# Patient Record
Sex: Male | Born: 1955 | Race: White | Hispanic: No | Marital: Married | Smoking: Never smoker
Health system: Southern US, Community
[De-identification: ages and names within clinical notes are randomized; demographics above are authoritative.]

## PROBLEM LIST (undated history)

## (undated) DIAGNOSIS — E785 Hyperlipidemia, unspecified: Secondary | ICD-10-CM

## (undated) DIAGNOSIS — I440 Atrioventricular block, first degree: Secondary | ICD-10-CM

## (undated) DIAGNOSIS — M419 Scoliosis, unspecified: Secondary | ICD-10-CM

## (undated) DIAGNOSIS — I10 Essential (primary) hypertension: Secondary | ICD-10-CM

## (undated) DIAGNOSIS — M1990 Unspecified osteoarthritis, unspecified site: Secondary | ICD-10-CM

## (undated) DIAGNOSIS — K529 Noninfective gastroenteritis and colitis, unspecified: Secondary | ICD-10-CM

## (undated) DIAGNOSIS — Z87438 Personal history of other diseases of male genital organs: Secondary | ICD-10-CM

## (undated) HISTORY — PX: TONSILLECTOMY: SUR1361

## (undated) HISTORY — DX: Unspecified osteoarthritis, unspecified site: M19.90

## (undated) HISTORY — DX: Scoliosis, unspecified: M41.9

## (undated) HISTORY — DX: Personal history of other diseases of male genital organs: Z87.438

## (undated) HISTORY — DX: Essential (primary) hypertension: I10

## (undated) HISTORY — PX: CATARACT EXTRACTION, BILATERAL: SHX1313

## (undated) HISTORY — DX: Atrioventricular block, first degree: I44.0

## (undated) HISTORY — DX: Hyperlipidemia, unspecified: E78.5

## (undated) HISTORY — DX: Noninfective gastroenteritis and colitis, unspecified: K52.9

---

## 2012-01-22 ENCOUNTER — Ambulatory Visit
Admission: RE | Admit: 2012-01-22 | Discharge: 2012-01-22 | Disposition: A | Discharge status: Home / Self Care | Payer: 59 | Source: Ambulatory Visit | Attending: Family Medicine | Admitting: Family Medicine

## 2012-01-22 ENCOUNTER — Ambulatory Visit (INDEPENDENT_AMBULATORY_CARE_PROVIDER_SITE_OTHER): Payer: 59 | Admitting: Family Medicine

## 2012-01-22 ENCOUNTER — Other Ambulatory Visit: Payer: Self-pay | Admitting: Family Medicine

## 2012-01-22 ENCOUNTER — Encounter: Payer: Self-pay | Admitting: Family Medicine

## 2012-01-22 VITALS — BP 165/98 | HR 66 | Ht 66.75 in | Wt 200.0 lb

## 2012-01-22 DIAGNOSIS — E785 Hyperlipidemia, unspecified: Secondary | ICD-10-CM

## 2012-01-22 DIAGNOSIS — R109 Unspecified abdominal pain: Secondary | ICD-10-CM

## 2012-01-22 DIAGNOSIS — R202 Paresthesia of skin: Secondary | ICD-10-CM

## 2012-01-22 DIAGNOSIS — Z87438 Personal history of other diseases of male genital organs: Secondary | ICD-10-CM

## 2012-01-22 DIAGNOSIS — R209 Unspecified disturbances of skin sensation: Secondary | ICD-10-CM

## 2012-01-22 DIAGNOSIS — M4120 Other idiopathic scoliosis, site unspecified: Secondary | ICD-10-CM

## 2012-01-22 DIAGNOSIS — IMO0001 Reserved for inherently not codable concepts without codable children: Secondary | ICD-10-CM

## 2012-01-22 DIAGNOSIS — R030 Elevated blood-pressure reading, without diagnosis of hypertension: Secondary | ICD-10-CM

## 2012-01-22 DIAGNOSIS — M419 Scoliosis, unspecified: Secondary | ICD-10-CM

## 2012-01-22 LAB — POCT URINALYSIS DIPSTICK
Glucose, UA: NEGATIVE
Ketones, UA: NEGATIVE
Leukocytes, UA: NEGATIVE
Spec Grav, UA: 1.02

## 2012-01-22 NOTE — Progress Notes (Signed)
Subjective:    Patient ID: Lance Hernandez, male    DOB: 10/31/1955, 56 y.o.   MRN: [id number]  HPI He is here today to establish care but he has several concerns. Having numbness in his pinky toes and in the 5th digits of his hands. No known triggers or alleviating factors.   Says is intermitatn. Some heaviness in the back of hte right posterior thigh. Occ feels nauseated. Wonders if has diabetes but is also very anxious.  Passed a kidney stone back in November.  Has some occ discomfort in his left flank that occ radiates into the leg.  No inc thrist or urination. Numbness is worse on the left hand but he is right handed. Occ stiffness in his neck but not very bothersome.    Says his BP normally runs 132/70. His blood pressure is typically elevated when he comes a doctor's office. He has never been on medication for it before.  Hyperlipidemia-he has he has had high cholesterol in the past but when he does well with his diet it is under control. He has been on medication at one point in time it has been years. His last full physical was 8 years ago.  He admits he has a lot of problems with anxiety. He says it tends to run in his family and overall he is just very anxious person.   Review of Systems  Constitutional: Negative for fever, diaphoresis and unexpected weight change.       Weakness.   HENT: Negative for hearing loss, rhinorrhea, sneezing and tinnitus.   Eyes: Negative for visual disturbance.  Respiratory: Negative for cough and wheezing.   Cardiovascular: Negative for chest pain and palpitations.  Gastrointestinal: Negative for nausea, vomiting, diarrhea and blood in stool.  Genitourinary: Negative for dysuria and discharge.  Musculoskeletal: Negative for myalgias and arthralgias.  Skin: Negative for rash.  Neurological: Negative for headaches.  Hematological: Negative for adenopathy.  Psychiatric/Behavioral: Negative for sleep disturbance and dysphoric mood. The patient is  nervous/anxious.        Objective:   Physical Exam  Constitutional: He is oriented to person, place, and time. He appears well-developed and well-nourished.       He is obese.  HENT:  Head: Normocephalic and atraumatic.  Neck: Neck supple. No thyromegaly present.  Cardiovascular: Normal rate, regular rhythm and normal heart sounds.        No carotid bruits  Pulmonary/Chest: Effort normal and breath sounds normal.  Musculoskeletal:       Neck with normal flexion, decreased extension, decreased rotation left and right but symmetric. Normal sidebending bilaterally. Shoulders and hips with normal range of motion. Shoulder, elbow, wrist strength is 5 over 5 bilaterally. He has normal grip with thumb, first finger, fifth finger. He does have sensation in his fifth digits bilaterally but feels decreased compared to his middle fingers. Hip, knee, ankle strength is 5 out of 5 bilaterally. Upper and lower extremity reflexes are 2+ and symmetric. Scoliosis of  thoracic spine.  Lymphadenopathy:    He has no cervical adenopathy.  Neurological: He is alert and oriented to person, place, and time. He has normal reflexes.       Upper and lower extremity reflexes are 2+ and symmetric.  Skin: Skin is warm and dry.  Psychiatric: He has a normal mood and affect. His behavior is normal.          Assessment & Plan:  Paresthesias - this could be coming from spinal stenosis versus herniated disc  issue. Also consider peripheral neuropathy from vitamin B12 deficiency, thyroid problems et Karie Soda. His A1c looks fantastic today. I reassured him he has no sign of diabetes. We will start with a cervical x-ray films as well as some blood work. He'll call for results and see him back in one month.  Elevated BP - Says he is really nervous.  Recheck on his blood pressure was improved but still not at goal. We will recheck his followup in one month. It sounds like his home blood pressures are under good control.  Hx of  high cholesterol. Additionally want to recheck his cholesterol to see where he is at on that. He is definitely overweight and has not been exercising regularly.  Left flank pain-I think this is probably more musculoskeletal related versus a kidney stone. History office was negative for blood today. Thus his pain is worse with activity and movement that I really think this is coming from his back. Upon gentle stretches and can take Tylenol as needed. Consider anti-inflammatory the right now his blood pressures not at goal.

## 2012-01-23 ENCOUNTER — Other Ambulatory Visit: Payer: Self-pay | Admitting: Family Medicine

## 2012-01-23 ENCOUNTER — Encounter: Payer: Self-pay | Admitting: Family Medicine

## 2012-01-23 DIAGNOSIS — E785 Hyperlipidemia, unspecified: Secondary | ICD-10-CM | POA: Insufficient documentation

## 2012-01-23 LAB — COMPLETE METABOLIC PANEL WITH GFR
ALT: 24 U/L (ref 0–53)
AST: 23 U/L (ref 0–37)
CO2: 29 mEq/L (ref 19–32)
Calcium: 10 mg/dL (ref 8.4–10.5)
Chloride: 101 mEq/L (ref 96–112)
GFR, Est African American: >89 mL/min
Sodium: 139 mEq/L (ref 135–145)
Total Bilirubin: 0.9 mg/dL (ref 0.3–1.2)
Total Protein: 8 g/dL (ref 6.0–8.3)

## 2012-01-23 LAB — CBC
MCH: 31.8 pg (ref 26.0–34.0)
MCV: 94 fL (ref 78.0–100.0)
Platelets: 248 10*3/uL (ref 150–400)
RBC: 5.16 MIL/uL (ref 4.22–5.81)
RDW: 12.8 % (ref 11.5–15.5)

## 2012-01-23 LAB — LIPID PANEL
HDL: 64 mg/dL (ref >39)
LDL Cholesterol: 137 mg/dL — ABNORMAL HIGH (ref 0–99)
Triglycerides: 149 mg/dL (ref <150)
VLDL: 30 mg/dL (ref 0–40)

## 2012-01-23 LAB — CK: Total CK: 58 U/L (ref 7–232)

## 2012-01-23 LAB — TSH: TSH: 1.695 u[IU]/mL (ref 0.350–4.500)

## 2012-01-23 MED ORDER — PRAVASTATIN SODIUM 40 MG PO TABS: 40.0000 mg | ORAL_TABLET | Freq: Every day | ORAL | Status: DC

## 2012-01-29 ENCOUNTER — Ambulatory Visit
Admission: RE | Admit: 2012-01-29 | Discharge: 2012-01-29 | Disposition: A | Discharge status: Home / Self Care | Payer: 59 | Source: Ambulatory Visit | Attending: Sports Medicine | Admitting: Sports Medicine

## 2012-01-29 ENCOUNTER — Other Ambulatory Visit: Payer: Self-pay | Admitting: Sports Medicine

## 2012-01-29 DIAGNOSIS — M545 Low back pain, unspecified: Secondary | ICD-10-CM

## 2012-02-21 ENCOUNTER — Ambulatory Visit: Payer: 59 | Admitting: Family Medicine

## 2012-06-11 ENCOUNTER — Other Ambulatory Visit: Payer: Self-pay | Admitting: *Deleted

## 2012-06-11 MED ORDER — PRAVASTATIN SODIUM 40 MG PO TABS: 40.0000 mg | ORAL_TABLET | Freq: Every day | ORAL | Status: DC

## 2012-06-17 ENCOUNTER — Telehealth: Payer: Self-pay | Admitting: Family Medicine

## 2012-06-17 ENCOUNTER — Ambulatory Visit (INDEPENDENT_AMBULATORY_CARE_PROVIDER_SITE_OTHER): Payer: 59 | Admitting: Family Medicine

## 2012-06-17 ENCOUNTER — Encounter: Payer: Self-pay | Admitting: Family Medicine

## 2012-06-17 ENCOUNTER — Ambulatory Visit (INDEPENDENT_AMBULATORY_CARE_PROVIDER_SITE_OTHER): Payer: 59

## 2012-06-17 VITALS — BP 173/98 | HR 77 | Wt 227.0 lb

## 2012-06-17 DIAGNOSIS — R053 Chronic cough: Secondary | ICD-10-CM

## 2012-06-17 DIAGNOSIS — R0602 Shortness of breath: Secondary | ICD-10-CM

## 2012-06-17 DIAGNOSIS — I10 Essential (primary) hypertension: Secondary | ICD-10-CM

## 2012-06-17 DIAGNOSIS — R059 Cough, unspecified: Secondary | ICD-10-CM

## 2012-06-17 DIAGNOSIS — R42 Dizziness and giddiness: Secondary | ICD-10-CM

## 2012-06-17 DIAGNOSIS — R0989 Other specified symptoms and signs involving the circulatory and respiratory systems: Secondary | ICD-10-CM

## 2012-06-17 DIAGNOSIS — R05 Cough: Secondary | ICD-10-CM

## 2012-06-17 DIAGNOSIS — I440 Atrioventricular block, first degree: Secondary | ICD-10-CM

## 2012-06-17 LAB — LIPID PANEL
HDL: 60 mg/dL (ref >39)
LDL Cholesterol: 148 mg/dL — ABNORMAL HIGH (ref 0–99)
Total CHOL/HDL Ratio: 4 Ratio

## 2012-06-17 LAB — CBC WITH DIFFERENTIAL/PLATELET
Basophils Absolute: 0 10*3/uL (ref 0.0–0.1)
Basophils Relative: 1 % (ref 0–1)
Hemoglobin: 15.5 g/dL (ref 13.0–17.0)
MCHC: 34.6 g/dL (ref 30.0–36.0)
Neutro Abs: 2.3 10*3/uL (ref 1.7–7.7)
Neutrophils Relative %: 55 % (ref 43–77)
Platelets: 232 10*3/uL (ref 150–400)
RDW: 13.7 % (ref 11.5–15.5)

## 2012-06-17 LAB — TSH: TSH: 3.373 u[IU]/mL (ref 0.350–4.500)

## 2012-06-17 LAB — COMPLETE METABOLIC PANEL WITH GFR
ALT: 52 U/L (ref 0–53)
CO2: 26 mEq/L (ref 19–32)
Chloride: 104 mEq/L (ref 96–112)
GFR, Est African American: >89 mL/min
Sodium: 141 mEq/L (ref 135–145)
Total Bilirubin: 1 mg/dL (ref 0.3–1.2)
Total Protein: 7.5 g/dL (ref 6.0–8.3)

## 2012-06-17 MED ORDER — LISINOPRIL 20 MG PO TABS: 20.0000 mg | ORAL_TABLET | Freq: Every day | ORAL | Status: DC

## 2012-06-17 NOTE — Telephone Encounter (Signed)
Patient request to know if a generic Lisinopril can be called into [facility] in [place] today just to try out first and then if the prescription works he would like it called into [facility]. Thanks

## 2012-06-17 NOTE — Progress Notes (Signed)
Subjective:    Patient ID: Lance Hernandez, male    DOB: [birth year], 56 y.o.   MRN: [id number]  HPI Has gained 30lbs in the last 6 months bc not working out.  BP is high today.  Last 3 days has felt it is a little hard to breath. Noticed a rusty phlegm taste in his mouth that started a few days before the SOB.  Has had a cough as well.  Says feeling a little lightheaded like he might pass out. No actual syncope. No vertigo. No recent head trauma or injuries..  Traveling next week. No fever.  No HA.  Mild nasal congestion. No facial pain and pressure.  Does have a hx of anxiety.  No CP.  No palpitations.  Not a smoker.  He says his blood pressures always high when he comes to the doctor's office. He also reports a chronic cough that comes and goes.   Review of Systems  BP 173/98  Pulse 77  Wt 227 lb (102.967 kg)    Allergies  Allergen Reactions  . Sulfa Antibiotics Rash    Past Medical History  Diagnosis Date  . Hypertension   . Hyperlipidemia   . Colitis     No past surgical history on file.  History   Social History  . Marital Status: Married    Spouse Name: susan     Number of Children: N/A  . Years of Education: N/A   Occupational History  . Not on file.   Social History Main Topics  . Smoking status: Never Smoker   . Smokeless tobacco: Not on file  . Alcohol Use: No  . Drug Use: No  . Sexually Active: Yes -- Male partner(s)   Other Topics Concern  . Not on file   Social History Narrative   Next caffeine. No regular exercise. He does plan on joining a gym. His wife is diabetic.    Family History  Problem Relation Age of Onset  . COPD Mother     Smoker  . COPD Sister     Smoker    Outpatient Encounter Prescriptions as of 06/17/2012  Medication Sig Dispense Refill  . pravastatin (PRAVACHOL) 40 MG tablet Take 1 tablet (40 mg total) by mouth at bedtime.  90 tablet  0          Objective:   Physical Exam  Constitutional: He is oriented to person, place,  and time. He appears well-developed and well-nourished.  HENT:  Head: Normocephalic and atraumatic.  Right Ear: External ear normal.  Left Ear: External ear normal.  Nose: Nose normal.  Mouth/Throat: Oropharynx is clear and moist.       TMs and canals are clear.   Eyes: Conjunctivae and EOM are normal. Pupils are equal, round, and reactive to light.  Neck: Neck supple. No thyromegaly present.  Cardiovascular: Normal rate and normal heart sounds.   Pulmonary/Chest: Effort normal and breath sounds normal.  Abdominal: Soft. Bowel sounds are normal. He exhibits no distension and no mass. There is no tenderness. There is no rebound and no guarding.  Lymphadenopathy:    He has no cervical adenopathy.  Neurological: He is alert and oriented to person, place, and time.  Skin: Skin is warm and dry.  Psychiatric: He has a normal mood and affect.    Mouth appears dry today      Assessment & Plan:  Dizziness - this may be coming from his high blood pressure. Also consider the possibility of sinusitis even  though he is not having fevers or facial pain or pressure but he is having some chronic drainage that he says tastes rusty. Check EKG today.   Short of breath-will get an EKG today he's not having actual chest pain.  I would also like to get a chest x-ray today. We will also check a CBC with differential to rule out infectious cause. His lung exam is normal which makes bronchitis less likely but he does have a cough that is productive as well as some increased shortness of breath at the last few days. He has been afebrile. EKG shows rate of 69 beats per minute, normal sinus rhythm, normal axis. Increased PR interval, first-degree AV block. T wave is inverted in lead 3. No other acute ST-T wave changes. I will check to see if having old EKGs for comparison.  Hypertension-uncontrolled. Most likely increase from his recent weight gain. We will start an ACE inhibitor. Would like to check his creatinine  and kidney function today. Followup in 3 weeks. Continue work on low-fat low-salt diet and regular exercise as tolerated.  First degree AV block-as far as I can tell this is new. Unfortunately do not have any old EKGs for comparison. This could be from cardiomyopathy versus ischemic disease. He is on the medications that should cause this. I would like to refer him to cardiology to see if they recommend further workup. He is asymptomatic as far as chest pain is concerned.

## 2012-06-17 NOTE — Telephone Encounter (Signed)
Med sent to [facility] per pt's request.

## 2012-06-18 NOTE — Telephone Encounter (Signed)
Labs ordered.

## 2012-06-19 ENCOUNTER — Ambulatory Visit (INDEPENDENT_AMBULATORY_CARE_PROVIDER_SITE_OTHER): Payer: 59 | Admitting: Family Medicine

## 2012-06-19 ENCOUNTER — Encounter: Payer: Self-pay | Admitting: Family Medicine

## 2012-06-19 VITALS — BP 159/93 | HR 78 | Temp 97.8°F | Ht 66.75 in | Wt 228.0 lb

## 2012-06-19 DIAGNOSIS — R0602 Shortness of breath: Secondary | ICD-10-CM

## 2012-06-19 MED ORDER — ALBUTEROL SULFATE HFA 108 (90 BASE) MCG/ACT IN AERS: 2.0000 | INHALATION_SPRAY | Freq: Four times a day (QID) | RESPIRATORY_TRACT | Status: DC | PRN

## 2012-06-19 NOTE — Progress Notes (Signed)
CC: Lance Hernandez is a 56 y.o. male is here for Shortness of Breath   Subjective: HPI:  Patient presents due to continued shortness of breath, he has not developed any worse but doesn't feel that much better even since being seen in our office 2 days earlier. He describes his discomfort as a "I need to gasp for breath at time. "Interestingly he feels that it actually improves sometimes when he is up moving around. He denies any orthopnea, exertional chest pain, peripheral edema. This discomfort is been present for a little over a week now and has been associated with a "phlegmy cough". He denies any sensation of chest congestion or wheezing, he denies any color to his productive cough. He denies any fevers, chills, abdominal pain. He states that he is suspicious that anxiety may be playing into his shortness of breath but is not sure what he is anxious about.  Interventions have included a chest x-ray which was unremarkable, anemia has been ruled out, a d-dimer was negative, and a metabolic panel was essentially normal as well from his last visit. An EKG showed first-degree AV block but no other abnormalities.  He tells me that his oldest of 2 weeks worth of cigarettes and that was decades ago.    Review Of Systems Outlined In HPI  Past Medical History  Diagnosis Date  . Hypertension   . Hyperlipidemia   . Colitis      Family History  Problem Relation Age of Onset  . COPD Mother     Smoker  . COPD Sister     Smoker     History  Substance Use Topics  . Smoking status: Never Smoker   . Smokeless tobacco: Not on file  . Alcohol Use: No     Objective: Filed Vitals:   06/19/12 1123  BP: 159/93  Pulse: 78  Temp: 97.8 F (36.6 C)    General: Alert and Oriented, No Acute Distress HEENT: Pupils equal, round, reactive to light. Conjunctivae clear.  External ears unremarkable, canals clear with intact TMs with appropriate landmarks.  Middle ear appears open without effusion. Pink  inferior turbinates.  Moist mucous membranes, pharynx without inflammation nor lesions.  Neck supple without palpable lymphadenopathy nor abnormal masses. Lungs: Clear to auscultation bilaterally, no wheezing/ronchi/rales.  Comfortable work of breathing. Good air movement. Cardiac: Regular rate and rhythm. Normal S1/S2.  No murmurs, rubs, nor gallops.   Abdomen: Obese Normal bowel sounds, soft and non tender without palpable masses. Extremities: No peripheral edema.  Strong peripheral pulses.  Mental Status: No depression, anxiety, nor agitation. Skin: Warm and dry.  Assessment & Plan: Lance Hernandez was seen today for shortness of breath.  Diagnoses and associated orders for this visit:  Shortness of breath  Other Orders - albuterol (PROVENTIL HFA;VENTOLIN HFA) 108 (90 BASE) MCG/ACT inhaler; Inhale 2 puffs into the lungs every 6 (six) hours as needed for wheezing.    in office spirometry was performed with an FVC of 101%, FEV1 of 83%, FEV1 1 over FVC of 84%. He doesn't appear that he has a restrictive defect nor an considerable obstructive defect. Reassurance was given to the patient today after discussion of blood work, EKG and screening lung function tests that showed no considerable abnormalities to account for her shortness of breath. We discussed a trial of albuterol to see if that would help alleviate his symptoms which he was quite interested in. He has an appointment with a cardiologist later this month have asked him to return for  shortness of breath has not improved or deteriorated after his week of vacation coming upSigns and symptoms requring emergent/urgent reevaluation were discussed with the patient.  Return if symptoms worsen or fail to improve.  Requested Prescriptions   Signed Prescriptions Disp Refills  . albuterol (PROVENTIL HFA;VENTOLIN HFA) 108 (90 BASE) MCG/ACT inhaler 1 Inhaler 2    Sig: Inhale 2 puffs into the lungs every 6 (six) hours as needed for wheezing.

## 2012-06-24 ENCOUNTER — Telehealth: Payer: Self-pay | Admitting: *Deleted

## 2012-06-24 DIAGNOSIS — R0602 Shortness of breath: Secondary | ICD-10-CM

## 2012-07-07 ENCOUNTER — Encounter: Payer: Self-pay | Admitting: Cardiology

## 2012-07-07 ENCOUNTER — Encounter: Payer: Self-pay | Admitting: *Deleted

## 2012-07-08 ENCOUNTER — Ambulatory Visit (INDEPENDENT_AMBULATORY_CARE_PROVIDER_SITE_OTHER): Payer: 59 | Admitting: Cardiology

## 2012-07-08 ENCOUNTER — Encounter: Payer: Self-pay | Admitting: Cardiology

## 2012-07-08 VITALS — BP 169/102 | HR 95 | Wt 226.0 lb

## 2012-07-08 DIAGNOSIS — E785 Hyperlipidemia, unspecified: Secondary | ICD-10-CM

## 2012-07-08 DIAGNOSIS — I10 Essential (primary) hypertension: Secondary | ICD-10-CM

## 2012-07-08 DIAGNOSIS — I440 Atrioventricular block, first degree: Secondary | ICD-10-CM

## 2012-07-08 DIAGNOSIS — R0600 Dyspnea, unspecified: Secondary | ICD-10-CM | POA: Insufficient documentation

## 2012-07-08 NOTE — Assessment & Plan Note (Signed)
Patient's blood pressure is elevated today. However he is anxious in the office. I have asked him to continue his present medications and follow blood pressure. Increase medications as needed.

## 2012-07-08 NOTE — Assessment & Plan Note (Signed)
No significant symptoms. No further evaluation.

## 2012-07-08 NOTE — Assessment & Plan Note (Signed)
Etiology unclear. Recent d-dimer negative. Chest x-ray unremarkable. Spirometry appears reasonable. Question component of deconditioning. Patient has risk factors and is planning to initiate an exercise program. Will arrange exercise treadmill for risk stratification. Will arrange echocardiogram to quantify LV function. Discussed importance of weight loss and exercise.

## 2012-07-08 NOTE — Assessment & Plan Note (Signed)
Continue statin. 

## 2012-07-08 NOTE — Patient Instructions (Addendum)
Your physician recommends that you schedule a follow-up appointment in: AS NEEDED PENDING TEST RESULTS  Your physician has requested that you have an exercise tolerance test. For further information please visit [email or web address] Please also follow instruction sheet, as given.AT [facility] OFFICE   Your physician has requested that you have an echocardiogram. Echocardiography is a painless test that uses sound waves to create images of your heart. It provides your doctor with information about the size and shape of your heart and how well your heart's chambers and valves are working. This procedure takes approximately one hour. There are no restrictions for this procedure.AT [facility] OFFICE    Exercise Stress Electrocardiography An exercise stress test is a heart test (EKG) which is done while you are moving. You will walk on a treadmill. This test will tell your doctor how your heart does when it is forced to work harder and how much activity you can safely handle. BEFORE THE TEST  Wear shorts or athletic pants.   Wear comfortable tennis shoes.   Women need to wear a bra that allows patches to be put on under it.  TEST  An EKG cable will be attached to your waist. This cable is hooked up to patches, which look like round stickers stuck to your chest.   You will be asked to walk on the treadmill.   You will walk until you are too tired or until you are told to stop.   Tell the doctor right away if you have:   Chest pain.   Leg cramps.   Shortness of breath.   Dizziness.   The test may last 30 minutes to 1 hour. The timing depends on your physical condition and the condition of your heart.  AFTER THE TEST  You will rest for about 6 minutes. During this time, your heart rhythm and blood pressure will be checked.   The testing equipment will be removed from your body and you can get dressed.   You may go home or back to your hospital room. You may keep doing all your usual  activities as told by your doctor.  Finding out the results of your test Ask when your test results will be ready. Make sure you get your test results. Document Released: 03/18/2008 Document Revised: 09/19/2011 Document Reviewed: 03/18/2008 [facility] Patient Information 2012 [place], [place].

## 2012-07-08 NOTE — Progress Notes (Signed)
  HPI: 56 year old male for evaluation of dyspnea. Chest x-ray on 06/17/2012 showed probable bibasilar scarring but otherwise clear. D-dimer 0.27. Renal function normal. TSH 3.373. Hemoglobin 15.5. In office spirometry on September 6 showed an FVC of 101%, FEV1 of 83%, and FEV1/FVC of 84%. Patient describes dyspnea with more moderate activities but not routine activities. There is no orthopnea, PND or pedal edema. He occasionally feels a sensation like "I need to take a deep breath". He denies chest pain, palpitations or syncope. Because of the above we were asked to evaluate.  Current Outpatient Prescriptions  Medication Sig Dispense Refill  . albuterol (PROVENTIL HFA;VENTOLIN HFA) 108 (90 BASE) MCG/ACT inhaler Inhale 2 puffs into the lungs every 6 (six) hours as needed for wheezing.  1 Inhaler  2  . lisinopril (PRINIVIL,ZESTRIL) 20 MG tablet Take 1 tablet (20 mg total) by mouth daily.  30 tablet  0  . pravastatin (PRAVACHOL) 40 MG tablet Take 1 tablet (40 mg total) by mouth at bedtime.  90 tablet  0    Allergies  Allergen Reactions  . Sulfa Antibiotics Rash    Past Medical History  Diagnosis Date  . Hypertension   . Hyperlipidemia   . Colitis   . Scoliosis   . History of prostatitis   . First degree AV block   . DJD (degenerative joint disease)     Past Surgical History  Procedure Date  . Tonsillectomy     History   Social History  . Marital Status: Married    Spouse Name: susan     Number of Children: 1  . Years of Education: N/A   Occupational History  .      Computer work   Social History Main Topics  . Smoking status: Never Smoker   . Smokeless tobacco: Not on file  . Alcohol Use: Yes     Rarely  . Drug Use: No  . Sexually Active: Yes -- Male partner(s)   Other Topics Concern  . Not on file   Social History Narrative   Next caffeine. No regular exercise. He does plan on joining a gym. His wife is diabetic.    Family History  Problem Relation Age of  Onset  . COPD Mother     Smoker  . COPD Sister     Smoker    ROS: some low back pain but no fevers or chills, productive cough, hemoptysis, dysphasia, odynophagia, melena, hematochezia, dysuria, hematuria, rash, seizure activity, orthopnea, PND, pedal edema, claudication. Remaining systems are negative.  Physical Exam:  Blood pressure 169/102, pulse 95, weight 226 lb (102.513 kg).  General:  Well developed/obese in NAD Skin warm/dry Patient not depressed No peripheral clubbing Back-normal HEENT-normal/normal eyelids Neck supple/normal carotid upstroke bilaterally; no bruits; no JVD; no thyromegaly chest - CTA/ normal expansion CV - RRR/normal S1 and S2; no murmurs, rubs or gallops;  PMI nondisplaced Abdomen -NT/ND, no HSM, no mass, + bowel sounds, no bruit 2+ femoral pulses, no bruits Ext-no edema, chords, 2+ DP Neuro-grossly nonfocal  ECG 06/17/2012-sinus rhythm with first degree AV block. No ST changes.

## 2012-07-13 ENCOUNTER — Other Ambulatory Visit: Payer: Self-pay | Admitting: *Deleted

## 2012-07-13 MED ORDER — LISINOPRIL 20 MG PO TABS: 20.0000 mg | ORAL_TABLET | Freq: Every day | ORAL | Status: DC

## 2012-07-17 ENCOUNTER — Ambulatory Visit (HOSPITAL_COMMUNITY): Payer: 59 | Attending: Internal Medicine

## 2012-07-17 DIAGNOSIS — R0609 Other forms of dyspnea: Secondary | ICD-10-CM | POA: Insufficient documentation

## 2012-07-17 DIAGNOSIS — I10 Essential (primary) hypertension: Secondary | ICD-10-CM | POA: Insufficient documentation

## 2012-07-17 DIAGNOSIS — R0989 Other specified symptoms and signs involving the circulatory and respiratory systems: Secondary | ICD-10-CM | POA: Insufficient documentation

## 2012-07-17 DIAGNOSIS — I379 Nonrheumatic pulmonary valve disorder, unspecified: Secondary | ICD-10-CM | POA: Insufficient documentation

## 2012-07-17 DIAGNOSIS — R0600 Dyspnea, unspecified: Secondary | ICD-10-CM

## 2012-07-17 DIAGNOSIS — I369 Nonrheumatic tricuspid valve disorder, unspecified: Secondary | ICD-10-CM | POA: Insufficient documentation

## 2012-07-17 DIAGNOSIS — R0602 Shortness of breath: Secondary | ICD-10-CM

## 2012-07-17 NOTE — Progress Notes (Signed)
Echocardiogram performed.  

## 2012-07-20 ENCOUNTER — Telehealth: Payer: Self-pay | Admitting: Cardiology

## 2012-07-20 NOTE — Telephone Encounter (Signed)
New mess:  Pt had EKG on Friday and would like to get the results.  Please call

## 2012-07-20 NOTE — Telephone Encounter (Signed)
Spoke with pt, aware of echo results. 

## 2012-07-22 ENCOUNTER — Ambulatory Visit (INDEPENDENT_AMBULATORY_CARE_PROVIDER_SITE_OTHER): Payer: 59 | Admitting: Physician Assistant

## 2012-07-22 DIAGNOSIS — R0602 Shortness of breath: Secondary | ICD-10-CM

## 2012-07-22 DIAGNOSIS — R0600 Dyspnea, unspecified: Secondary | ICD-10-CM

## 2012-07-22 NOTE — Progress Notes (Addendum)
Exercise Treadmill Test  Pre-Exercise Testing Evaluation Rhythm: normal sinus  Rate: 65   PR:  .20 QRS:  .09  QT:  .39 QTc: .41           Test  Exercise Tolerance Test Ordering MD: Olga Millers, MD  Interpreting MD:  Tereso Newcomer, PA-C  Unique Test No: 1  Treadmill:  1  Indication for ETT: exertional dyspnea  Contraindication to ETT: No   Stress Modality: exercise - treadmill  Cardiac Imaging Performed: non   Protocol: standard Bruce - maximal  Max BP:  192/80  Max MPHR (bpm):  165 85% MPR (bpm):  140  MPHR obtained (bpm):  166 % MPHR obtained:  100%  Reached 85% MPHR (min:sec):  5:20 Total Exercise Time (min-sec):  8:00  Workload in METS:  10.1 Borg Scale: 16  Reason ETT Terminated:  desired heart rate attained    ST Segment Analysis At Rest: normal ST segments - no evidence of significant ST depression With Exercise: no evidence of significant ST depression  Other Information Arrhythmia:  No Angina during ETT:  absent (0) Quality of ETT:  diagnostic  ETT Interpretation:  normal - no evidence of ischemia by ST analysis  Comments: Good exercise tolerance. No chest pain. Normal BP response to exercise. No ST-T changes to suggest ischemia.   Recommendations: Follow up with Dr. Olga Millers as recommended. Tereso Newcomer, PA-C  1:32 PM 07/22/2012

## 2012-07-29 ENCOUNTER — Telehealth: Payer: Self-pay | Admitting: Cardiology

## 2012-07-29 NOTE — Telephone Encounter (Signed)
Spoke with pt, aware of gxt results. He will try to get a bp cuff and check his bp when he is feeling lightheaded. He will start an exercise program and let us know of any problems

## 2012-07-29 NOTE — Telephone Encounter (Signed)
Pt would like results of stress test 

## 2012-08-18 ENCOUNTER — Other Ambulatory Visit: Payer: Self-pay | Admitting: Family Medicine

## 2012-09-23 ENCOUNTER — Other Ambulatory Visit: Payer: Self-pay | Admitting: Family Medicine

## 2013-01-22 IMAGING — CR DG LUMBAR SPINE COMPLETE 4+V
5 series · 5 of 5 positions shown · non-contrast
Comparison: None

CLINICAL DATA: Low back pain worse in the last 2 months

LUMBAR SPINE - COMPLETE 4+ VIEW

[view not recorded (1 of 5)]
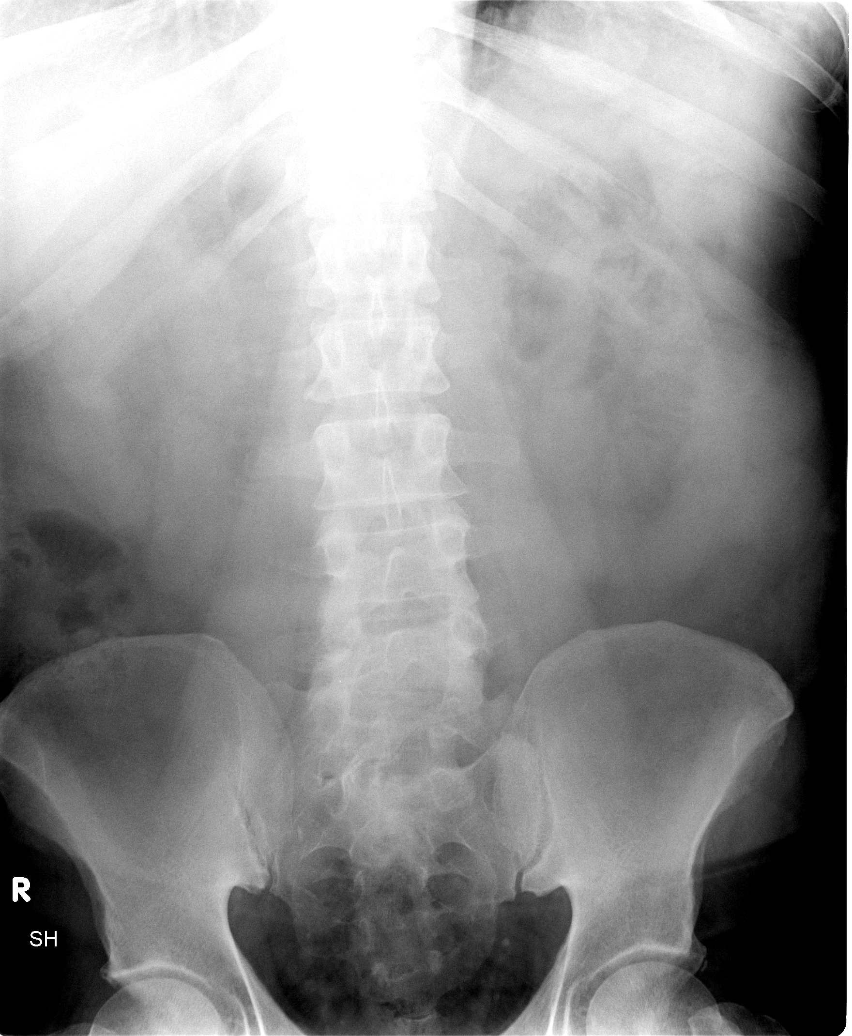

[view not recorded (2 of 5)]
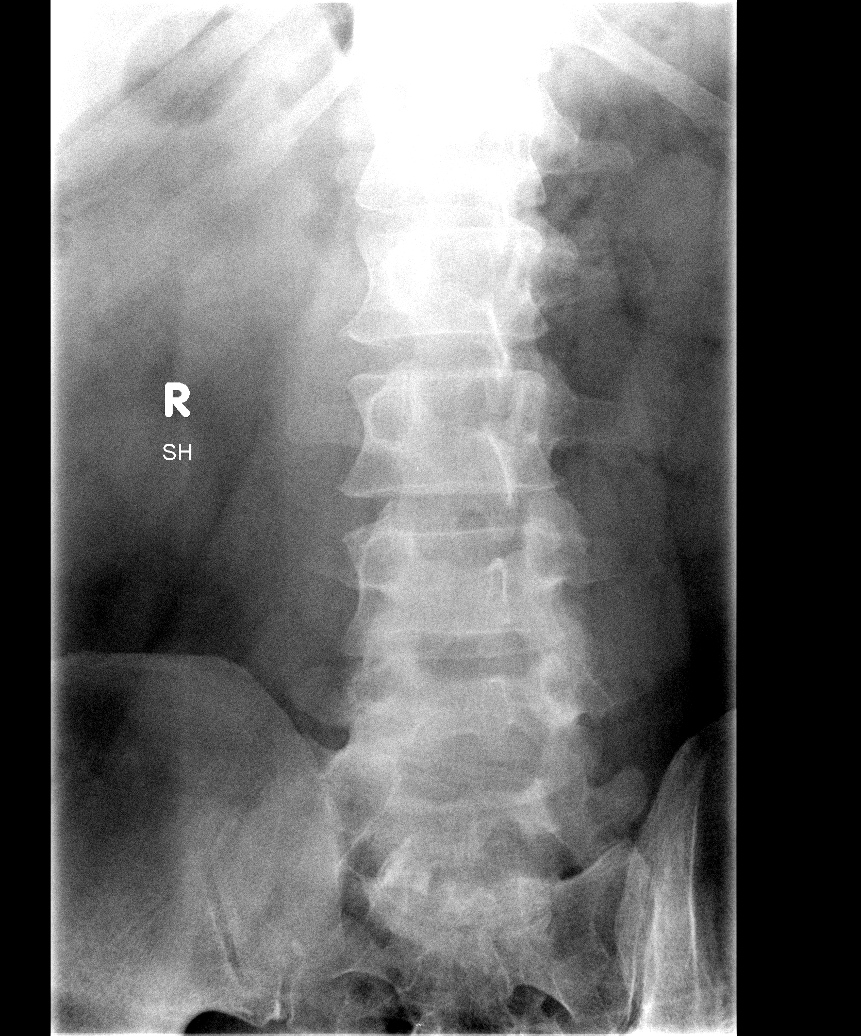

[view not recorded (3 of 5)]
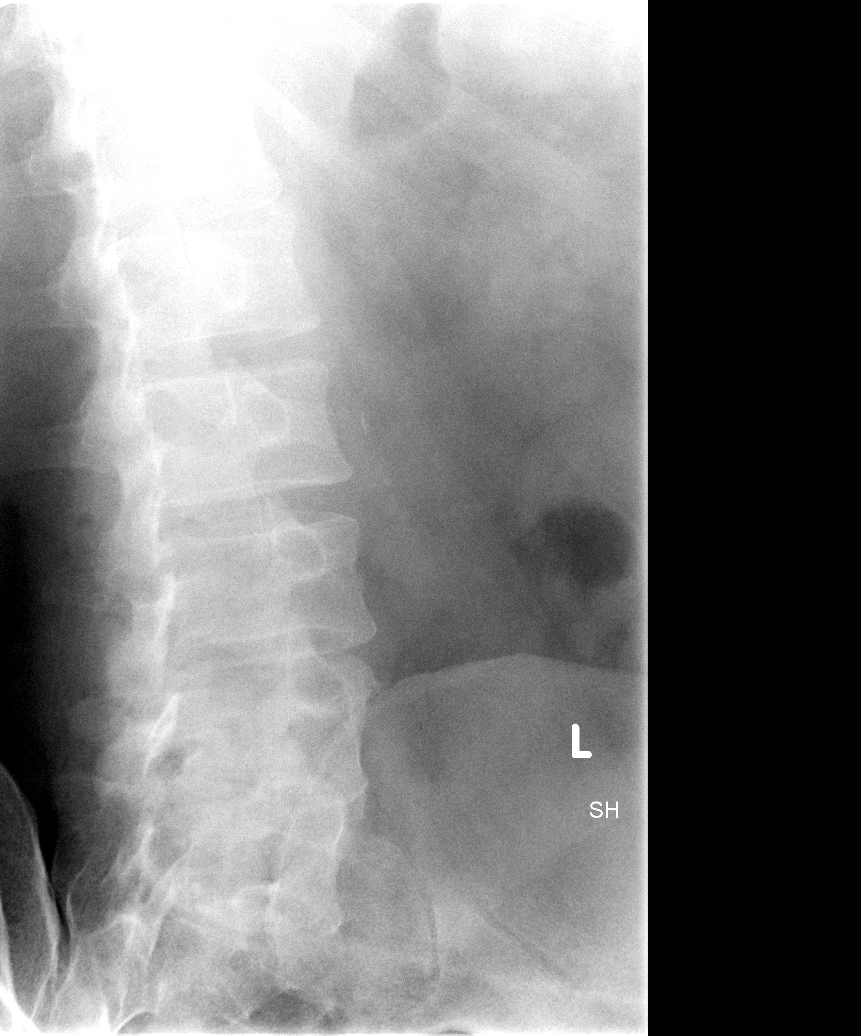

[view not recorded (4 of 5)]
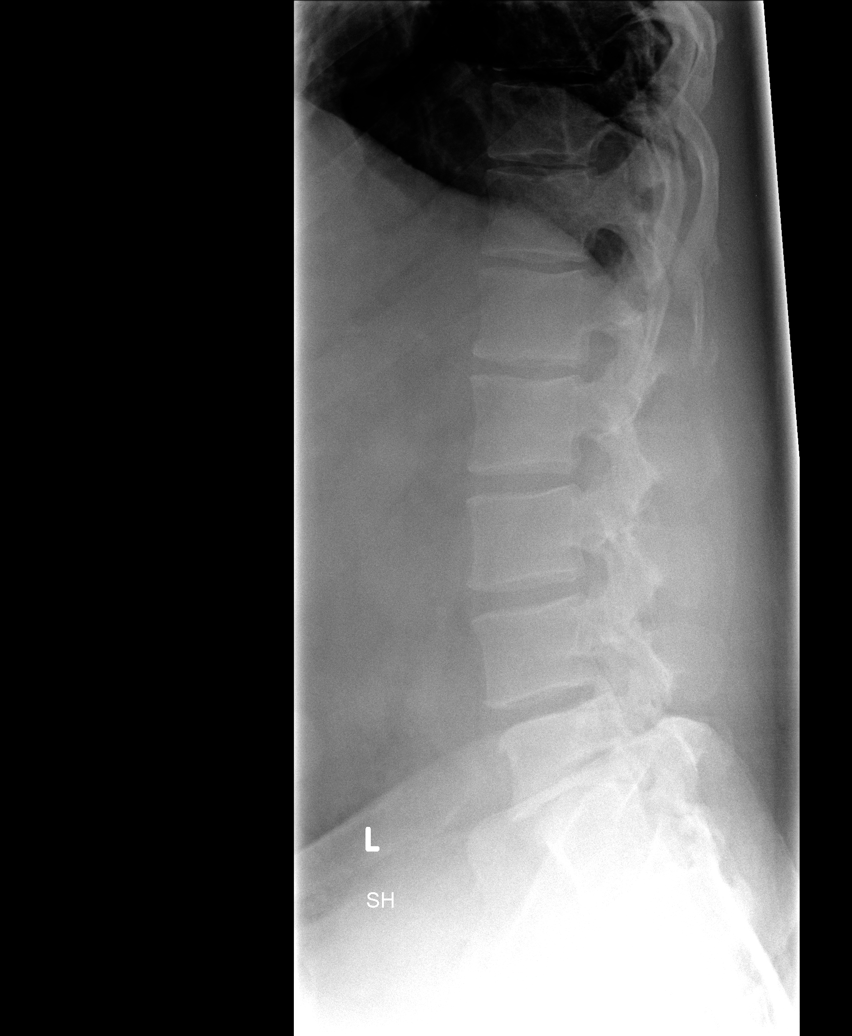

[view not recorded (5 of 5)]
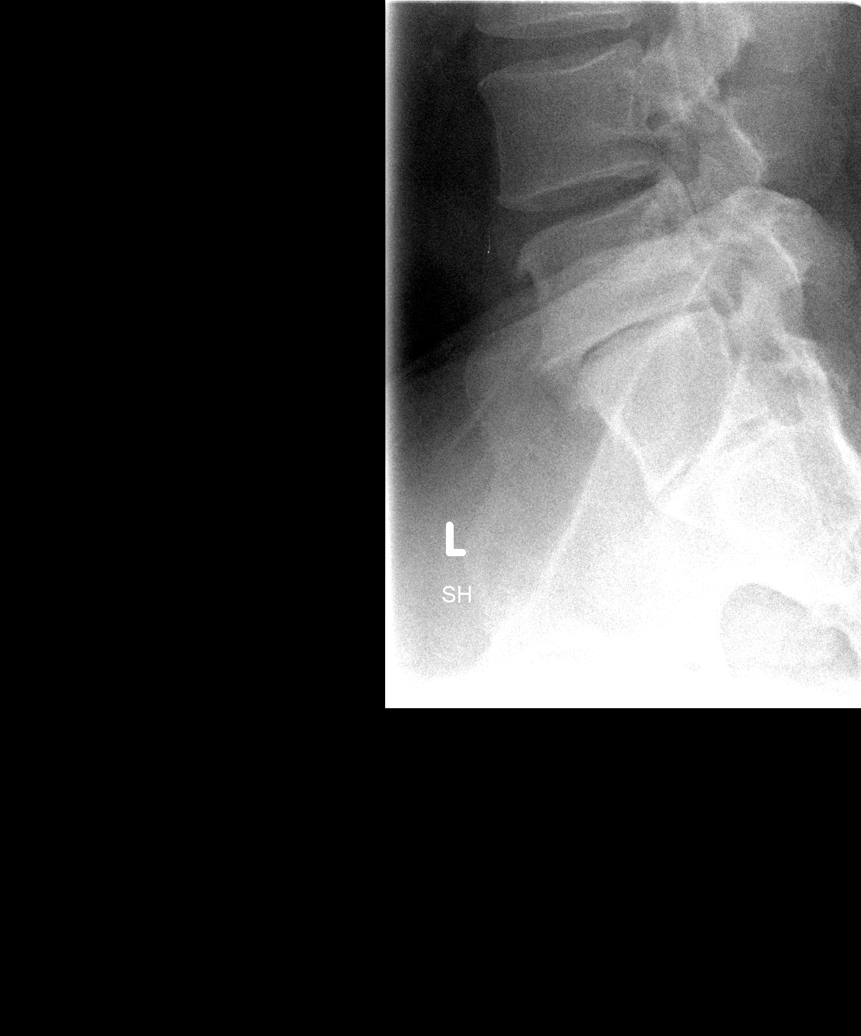

[5 of 5 positions shown; findings below may reference images not displayed]

FINDINGS: Five non-rib bearing lumbar vertebrae with an additional
incompletely sacralized segment at the lumbosacral junction.
Osseous mineralization normal.
Significant disc space narrowing with endplate spur formation at
the last open disc space.
Vertebral body heights maintained without fracture or subluxation.
No spondylolysis.
Facet joints symmetric.
IMPRESSION: Transitional anatomy with an incompletely sacralized segment at the
upper sacrum.
Degenerative disc disease changes lower lumbar spine.

## 2013-02-13 ENCOUNTER — Other Ambulatory Visit: Payer: Self-pay | Admitting: Family Medicine

## 2013-03-17 ENCOUNTER — Other Ambulatory Visit: Payer: Self-pay | Admitting: Family Medicine

## 2013-04-29 ENCOUNTER — Ambulatory Visit (INDEPENDENT_AMBULATORY_CARE_PROVIDER_SITE_OTHER): Payer: 59 | Admitting: Family Medicine

## 2013-04-29 ENCOUNTER — Encounter: Payer: Self-pay | Admitting: Family Medicine

## 2013-04-29 VITALS — BP 134/84 | HR 84 | Ht 67.0 in | Wt 230.0 lb

## 2013-04-29 DIAGNOSIS — I10 Essential (primary) hypertension: Secondary | ICD-10-CM

## 2013-04-29 DIAGNOSIS — N400 Enlarged prostate without lower urinary tract symptoms: Secondary | ICD-10-CM

## 2013-04-29 DIAGNOSIS — Z6836 Body mass index (BMI) 36.0-36.9, adult: Secondary | ICD-10-CM

## 2013-04-29 DIAGNOSIS — Z23 Encounter for immunization: Secondary | ICD-10-CM

## 2013-04-29 DIAGNOSIS — E785 Hyperlipidemia, unspecified: Secondary | ICD-10-CM

## 2013-04-29 DIAGNOSIS — Z0000 Encounter for general adult medical examination without abnormal findings: Secondary | ICD-10-CM

## 2013-04-29 LAB — COMPLETE METABOLIC PANEL WITH GFR
AST: 27 U/L (ref 0–37)
Albumin: 4.9 g/dL (ref 3.5–5.2)
Alkaline Phosphatase: 51 U/L (ref 39–117)
Potassium: 4.7 mEq/L (ref 3.5–5.3)
Sodium: 134 mEq/L — ABNORMAL LOW (ref 135–145)
Total Protein: 7.6 g/dL (ref 6.0–8.3)

## 2013-04-29 LAB — LIPID PANEL
Total CHOL/HDL Ratio: 3.8 Ratio
VLDL: 28 mg/dL (ref 0–40)

## 2013-04-29 LAB — HEMOGLOBIN A1C
Hgb A1c MFr Bld: 5.4 % (ref <5.7)
Mean Plasma Glucose: 108 mg/dL (ref <117)

## 2013-04-29 MED ORDER — LISINOPRIL 20 MG PO TABS: ORAL_TABLET | ORAL | Status: DC

## 2013-04-29 MED ORDER — PRAVASTATIN SODIUM 40 MG PO TABS: ORAL_TABLET | ORAL | Status: DC

## 2013-04-29 NOTE — Progress Notes (Signed)
Subjective:    Patient ID: Lance Hernandez, male    DOB: [birth year], 56 y.o.   MRN: [id number]  HPI Here for CPE today.   pt is fasting, he would like to discuss weight loss he has been exercising however because of some recent foot pain he has had to cut back. He is having a flare of plantar fascitis.  Started wearing his heel cushions and have helped. He has heard about Belviq and would like to try. He says he really struggles with his diet. He says he is addicted to french fries. He did start exercising recently but started having foot pain. So he has backed off of this.  HTN -  Pt denies chest pain, SOB, dizziness, or heart palpitations.  Taking meds as directed w/o problems.  Denies medication side effects.    He occasionally gets up once at night to urinate but not often. He has noticed some occasional decrease in urinary stream but says that if he drinks more the stream seems to be better. No pelvic pain or discomfort. No prior history of prostate problems or cancer.  Review of Systems   comprehensive ROS.   There were no vitals taken for this visit.    Allergies  Allergen Reactions  . Sulfa Antibiotics Rash    Past Medical History  Diagnosis Date  . Hypertension   . Hyperlipidemia   . Colitis   . Scoliosis   . History of prostatitis   . First degree AV block   . DJD (degenerative joint disease)     Past Surgical History  Procedure Laterality Date  . Tonsillectomy      History   Social History  . Marital Status: Married    Spouse Name: susan     Number of Children: 1  . Years of Education: N/A   Occupational History  .      Computer work   Social History Main Topics  . Smoking status: Never Smoker   . Smokeless tobacco: Not on file  . Alcohol Use: Yes     Comment: Rarely  . Drug Use: No  . Sexually Active: Yes -- Male partner(s)   Other Topics Concern  . Not on file   Social History Narrative   Next caffeine. No regular exercise. He does plan on  joining a gym. His wife is diabetic.    Family History  Problem Relation Age of Onset  . COPD Mother     Smoker  . COPD Sister     Smoker    Outpatient Encounter Prescriptions as of 04/29/2013  Medication Sig Dispense Refill  . albuterol (PROVENTIL HFA;VENTOLIN HFA) 108 (90 BASE) MCG/ACT inhaler Inhale 2 puffs into the lungs every 6 (six) hours as needed for wheezing.  1 Inhaler  2  . lisinopril (PRINIVIL,ZESTRIL) 20 MG tablet TAKE 1 TABLET (20 MG TOTAL) DAILY  90 tablet  1  . pravastatin (PRAVACHOL) 40 MG tablet TAKE 1 TABLET AT BEDTIME  90 tablet  1  . [DISCONTINUED] lisinopril (PRINIVIL,ZESTRIL) 20 MG tablet TAKE 1 TABLET (20 MG TOTAL) DAILY  90 tablet  1  . [DISCONTINUED] pravastatin (PRAVACHOL) 40 MG tablet TAKE 1 TABLET AT BEDTIME  90 tablet  0   No facility-administered encounter medications on file as of 04/29/2013.         Objective:   Physical Exam  Constitutional: He is oriented to person, place, and time. He appears well-developed and well-nourished.  HENT:  Head: Normocephalic and atraumatic.  Right Ear:  External ear normal.  Left Ear: External ear normal.  Nose: Nose normal.  Mouth/Throat: Oropharynx is clear and moist.  Eyes: Conjunctivae and EOM are normal. Pupils are equal, round, and reactive to light.  Neck: Normal range of motion. Neck supple. No thyromegaly present.  Cardiovascular: Normal rate, regular rhythm, normal heart sounds and intact distal pulses.   Pulmonary/Chest: Effort normal and breath sounds normal.  Abdominal: Soft. Bowel sounds are normal. He exhibits no distension and no mass. There is no tenderness. There is no rebound and no guarding.  Musculoskeletal: Normal range of motion.  Lymphadenopathy:    He has no cervical adenopathy.  Neurological: He is alert and oriented to person, place, and time. He has normal reflexes.  Skin: Skin is warm and dry.  Psychiatric: He has a normal mood and affect. His behavior is normal. Judgment and  thought content normal.   + dentures + mildly enlarged prostate, symmetric with no nodules.       Assessment & Plan:  CPE Keep up a regular exercise program and make sure you are eating a healthy diet Try to eat 4 servings of dairy a day, or if you are lactose intolerant take a calcium with vitamin D daily.  Your vaccines are up to date.  Tdap given.  Need for colonoscopy. He is extremely fearful. So we discussed the option of doing stool cards in the interim.  Obesity - discussed the importance of dietary changes and exercise regime. We did discuss using a weight loss medication. We discussed the risks and benefits and potential cost of the medications. For now he says he really wants to try to focus on diet and exercise. Encouraged him to come back in and discussed treatment options if he is getting frustrated and not making progress.  HTN- well controlled. F/U in 6 months.   Hyperlipidemia-- Tolerating statin well.   Plantar fasciitis-he says the heel pad has been helpful. I will give him a handout with stretches to start doing home on his own. If she's not improving and he could consider following up with my partner Dr. Rodney Langton for injections. Hopefully it will improve and he can get back into his exercise routine.  BPH- mild.  Will check psa. Given AUA qeustionnari to complete and drop off when drops off stool cards.

## 2013-05-20 ENCOUNTER — Telehealth (INDEPENDENT_AMBULATORY_CARE_PROVIDER_SITE_OTHER): Payer: 59 | Admitting: Family Medicine

## 2013-05-20 DIAGNOSIS — Z1211 Encounter for screening for malignant neoplasm of colon: Secondary | ICD-10-CM

## 2013-05-20 LAB — POC HEMOCCULT BLD/STL (HOME/3-CARD/SCREEN)
Card #3 Date: 8062014
Card #3 Fecal Occult Blood, POC: NEGATIVE

## 2013-05-20 NOTE — Telephone Encounter (Signed)
Called and lvm informing pt that Dr.Metheney can call in an inhaler for him. We do not have any samples to give to him.[name] [place]

## 2013-05-20 NOTE — Telephone Encounter (Signed)
Patient said he was just seen last month and advised that he has been coughing up white flem and has dizzyness. Pt request to know if he can have something called in to break up flem/ Pt states he is using Mucinex but has been really working. I did advise pt that if he was not seen for acute issue may need to get an appointment and he just stated that he was jus here last month and was wondering if he could possibly have something called in or maybe get a inhaler sample. Thanks

## 2013-05-21 MED ORDER — ALBUTEROL SULFATE HFA 108 (90 BASE) MCG/ACT IN AERS: 2.0000 | INHALATION_SPRAY | Freq: Four times a day (QID) | RESPIRATORY_TRACT | Status: DC | PRN

## 2013-05-21 NOTE — Addendum Note (Signed)
Addended by: Nani Gasser D on: 05/21/2013 08:21 AM   Modules accepted: Orders

## 2013-05-21 NOTE — Telephone Encounter (Signed)
Prescription presented. I was unable to find that particular [facility].

## 2013-05-21 NOTE — Telephone Encounter (Signed)
Pt informed about the rx being sent, also informed him that his stool cards were neg.[name] [place]

## 2013-05-21 NOTE — Telephone Encounter (Signed)
Pt would like the inhaler to be sent to the new [facility] on [address]. He was asking about the cost of this  And I told him it would be about $30-45? Will forward to Dr.Metheney.[name] [place]

## 2013-07-08 ENCOUNTER — Encounter: Payer: Self-pay | Admitting: Family Medicine

## 2013-07-08 ENCOUNTER — Ambulatory Visit (INDEPENDENT_AMBULATORY_CARE_PROVIDER_SITE_OTHER): Payer: 59 | Admitting: Family Medicine

## 2013-07-08 VITALS — BP 149/91 | HR 70 | Temp 97.7°F | Wt 233.0 lb

## 2013-07-08 DIAGNOSIS — R635 Abnormal weight gain: Secondary | ICD-10-CM

## 2013-07-08 DIAGNOSIS — G4700 Insomnia, unspecified: Secondary | ICD-10-CM

## 2013-07-08 DIAGNOSIS — H9209 Otalgia, unspecified ear: Secondary | ICD-10-CM

## 2013-07-08 DIAGNOSIS — J309 Allergic rhinitis, unspecified: Secondary | ICD-10-CM

## 2013-07-08 MED ORDER — FLUTICASONE PROPIONATE 50 MCG/ACT NA SUSP: 2.0000 | Freq: Every day | NASAL | Status: DC

## 2013-07-08 MED ORDER — PHENTERMINE HCL 37.5 MG PO CAPS: 37.5000 mg | ORAL_CAPSULE | ORAL | Status: DC

## 2013-07-08 NOTE — Progress Notes (Signed)
Subjective:    Patient ID: Lance Hernandez, male    DOB: Jul 17, 1956, 57 y.o.   MRN: [id number]  HPI  Bilat ear pain x 1 mo with cough. Using OTC meds. Tried peroxide in the ears.  Getting runny nose and post nasal drip.  Ears have been throbbing. Nofever, chills, or sweats.  He says that this all started after he did be ALS challenge where he got a bucket of ice water over his head. No hearing changes. He denies any facial pain or pressure. Has had an occasional headache.  Weight Loss-We had discussed optinos last OV. He has given it some extra thought and he is interested in medication and medication for weight loss. He's been really struggling with plantar fasciitis it has only been able to make it to the gym about twice a week and is only been able to burn about 100 calories with that. He was at work out more previously. In fact his weight is up 3 pounds in a little frustrated. He has tried keeping track of calories before..   Insomnia - Recently not sleeping well. He says his been going to bed around 11:00 and then around 3 or 4 in the morning wakes up and can't go back to sleep. This started about a month ago. He's not really sure why. He denies any new or specific stressors. He normally sleeps very well and wants to know what would be safe to take over-the-counter to help improve his sleep quality. No worsening or alleviating factors. He just felt much more tired since not sleeping well.  Review of Systems     Objective:   Physical Exam  Constitutional: He is oriented to person, place, and time. He appears well-developed and well-nourished.  HENT:  Head: Normocephalic and atraumatic.  Right Ear: External ear normal.  Left Ear: External ear normal.  Nose: Nose normal.  Mouth/Throat: Oropharynx is clear and moist.  TMs and canals are clear.   Eyes: Conjunctivae and EOM are normal. Pupils are equal, round, and reactive to light.  Neck: Neck supple. No thyromegaly present.  Cardiovascular:  Normal rate and normal heart sounds.   Pulmonary/Chest: Effort normal and breath sounds normal.  Lymphadenopathy:    He has no cervical adenopathy.  Neurological: He is alert and oriented to person, place, and time.  Skin: Skin is warm and dry.  Psychiatric: He has a normal mood and affect.          Assessment & Plan:  Bilateral ear pain-I think is directly related to eustachian tube dysfunction possibly allergic rhinitis especially with significant postnasal drip and cough. His chest exam is normal today at your exam is normal as well. Will treat with a regimen of antihistamine plus nasal steroid spray. If he is not noticing significant improvement by Monday then asked to call the office and we'll consider treatment with antibiotics for possible sinusitis since his symptoms have been going on for months at this point in time.  Abnormal weight gain-discussed options with him again. I think he would be a good candidate for phentermine since he has not had any major history of heart problems. He does have first-degree heart block and we will need to monitor his blood pressure very carefully. Avoid taking with other stimulants. Warned about potential side effects chest pain or shortness of breath and stop the medication immediately if this occurs. He will need a followup in one month for blood pressure and weight check with me and we also discussed  that if he is unable to lose weight on the medication that it will be discontinued.  Insomnia-I think he can certainly try Benadryl over-the-counter or possibly melatonin. If this is not helping then let me know at followup visit in one month. Getting back into some regular exercise can be helpful as well.  Plantar fasciitis-if this continues to be a problem consider referral to specialist for possible injection and splinting. I do not want this to prevent him from being able to exercise to his full potential.

## 2013-07-08 NOTE — Patient Instructions (Addendum)
Start Allegra or Zyrtec (take at night) or Claritin with the nasal spray.   Can try My Fitness Pal  Call if not better by Monday

## 2013-07-12 ENCOUNTER — Telehealth: Payer: Self-pay | Admitting: *Deleted

## 2013-07-12 MED ORDER — AMOXICILLIN-POT CLAVULANATE 875-125 MG PO TABS: 1.0000 | ORAL_TABLET | Freq: Two times a day (BID) | ORAL | Status: DC

## 2013-07-12 NOTE — Telephone Encounter (Signed)
Pt states he is still having the ear pain with the other symptoms and states it is no better. Says he was suppose to call today if no better and was asking for something to be sent to his pharmacy.  Meyer Cory, LPN

## 2013-07-12 NOTE — Telephone Encounter (Signed)
I will call over ABX. Let us know if nto imrproving by Friday

## 2013-07-12 NOTE — Telephone Encounter (Signed)
Pt informed.  Misty Ahmad, LPN  

## 2013-07-26 ENCOUNTER — Telehealth: Payer: Self-pay

## 2013-07-26 NOTE — Telephone Encounter (Signed)
ok 

## 2013-07-26 NOTE — Telephone Encounter (Signed)
FYI  Patient states he is going to stop the phentermine due to feeling nauseated. He states he will try a diet for weight loss.

## 2013-07-30 ENCOUNTER — Telehealth: Payer: Self-pay

## 2013-07-30 NOTE — Telephone Encounter (Signed)
Did he complete the augmentin,  If so then I recommend referrral to ENT

## 2013-07-30 NOTE — Telephone Encounter (Signed)
Pt states that he has been using an allergy med everyday but not the nasal spray consistently.  I advised him to use that every day & give it a try for several days & then if it's not helping then we will move forward with the ENT referral.

## 2013-07-30 NOTE — Telephone Encounter (Signed)
The hollow feeling in his ear is back. What can he take for this? He was seen a couple weeks ago. He can not come in for an appointment.

## 2013-08-13 ENCOUNTER — Telehealth: Payer: Self-pay

## 2013-08-13 DIAGNOSIS — H9209 Otalgia, unspecified ear: Secondary | ICD-10-CM

## 2013-08-13 NOTE — Telephone Encounter (Signed)
[name] would like an ENT referral. He has on going ear problems.

## 2013-08-13 NOTE — Telephone Encounter (Signed)
ENT referral placed.

## 2013-08-19 ENCOUNTER — Other Ambulatory Visit: Payer: Self-pay

## 2013-10-03 ENCOUNTER — Other Ambulatory Visit: Payer: Self-pay | Admitting: Family Medicine

## 2013-10-20 ENCOUNTER — Other Ambulatory Visit: Payer: Self-pay | Admitting: Family Medicine

## 2013-12-24 ENCOUNTER — Ambulatory Visit (INDEPENDENT_AMBULATORY_CARE_PROVIDER_SITE_OTHER): Payer: 59 | Admitting: Family Medicine

## 2013-12-24 ENCOUNTER — Encounter: Payer: Self-pay | Admitting: Family Medicine

## 2013-12-24 VITALS — BP 139/74 | HR 60 | Temp 97.8°F | Ht 65.25 in | Wt 199.0 lb

## 2013-12-24 DIAGNOSIS — L309 Dermatitis, unspecified: Secondary | ICD-10-CM

## 2013-12-24 DIAGNOSIS — H6980 Other specified disorders of Eustachian tube, unspecified ear: Secondary | ICD-10-CM

## 2013-12-24 DIAGNOSIS — L259 Unspecified contact dermatitis, unspecified cause: Secondary | ICD-10-CM

## 2013-12-24 DIAGNOSIS — R040 Epistaxis: Secondary | ICD-10-CM

## 2013-12-24 DIAGNOSIS — Z6832 Body mass index (BMI) 32.0-32.9, adult: Secondary | ICD-10-CM

## 2013-12-24 MED ORDER — LISINOPRIL 20 MG PO TABS: ORAL_TABLET | ORAL | Status: DC

## 2013-12-24 MED ORDER — MUPIROCIN 2 % EX OINT: TOPICAL_OINTMENT | CUTANEOUS | Status: DC

## 2013-12-24 MED ORDER — PRAVASTATIN SODIUM 40 MG PO TABS: ORAL_TABLET | ORAL | Status: DC

## 2013-12-24 NOTE — Patient Instructions (Signed)
Nasal Ayr - apply inside the nose in the morning Recommend humidier in the bedtime.  Warm or cool mist.  Use the mupiricin ointment at bedtime.   Use cetaphil to moiturized the skin after shower.   Don't take hot showers. Can use oatmeal bath.

## 2013-12-24 NOTE — Progress Notes (Signed)
Subjective:    Patient ID: Weber Monnier, male    DOB: [birth year], 57 y.o.   MRN: [id number]  HPI  Having nose bleeds and had a blood taste in his mouth this AM and spit up some blood and it stopped. Yesterday bled from the left nostril for 2-3 minutes.  No blood in the stool.  Started taking allegra allergy.  Will itch this time of year and at night.  Still having problems with his ears. Was given astelin? And helped initially but then was too drying and cause a lot of crusting in his nose..  The right ear is the worst.  He feels like he cannot hear out of it nearly as well as his left ear. Has used Azelastain in the past with some relief. He complains of diffuse dry skin that is very itchy. It usually gets worse this time of year. He does not use any lotion over-the-counter products for this. Again it usually happens in the spring time. The Allegra is helping somewhat.  Obesity-he is actually really hasn't changed his diet. In fact she's doing Weight Watchers plus. He has lost over 30 pounds. His wife is also been losing weight. Review of Systems  BP 139/74  Pulse 60  Temp(Src) 97.8 F (36.6 C)  Ht 5' 5.25" (1.657 m)  Wt 199 lb (90.266 kg)  BMI 32.88 kg/m2  SpO2 95%    Allergies  Allergen Reactions  . Phentermine Other (See Comments)    Nausea  . Sulfa Antibiotics Rash    Past Medical History  Diagnosis Date  . Hypertension   . Hyperlipidemia   . Colitis   . Scoliosis   . History of prostatitis   . First degree AV block   . DJD (degenerative joint disease)     Past Surgical History  Procedure Laterality Date  . Tonsillectomy      History   Social History  . Marital Status: Married    Spouse Name: susan     Number of Children: 1  . Years of Education: N/A   Occupational History  .      Computer work   Social History Main Topics  . Smoking status: Never Smoker   . Smokeless tobacco: Not on file  . Alcohol Use: Yes     Comment: Rarely  . Drug Use: No  .  Sexual Activity: Yes    Partners: Female   Other Topics Concern  . Not on file   Social History Narrative   Next caffeine. No regular exercise. He does plan on joining a gym. His wife is diabetic.    Family History  Problem Relation Age of Onset  . COPD Mother     Smoker  . COPD Sister     Smoker    Outpatient Encounter Prescriptions as of 12/24/2013  Medication Sig  . lisinopril (PRINIVIL,ZESTRIL) 20 MG tablet TAKE 1 TABLET DAILY  . pravastatin (PRAVACHOL) 40 MG tablet TAKE 1 TABLET AT BEDTIME  . [DISCONTINUED] lisinopril (PRINIVIL,ZESTRIL) 20 MG tablet TAKE 1 TABLET DAILY  . [DISCONTINUED] pravastatin (PRAVACHOL) 40 MG tablet TAKE 1 TABLET AT BEDTIME  . mupirocin ointment (BACTROBAN) 2 % Apply to inside of each nares daily for 10 days then twice a week for maintenance.  . [DISCONTINUED] albuterol (PROVENTIL HFA;VENTOLIN HFA) 108 (90 BASE) MCG/ACT inhaler Inhale 2 puffs into the lungs every 6 (six) hours as needed for wheezing or shortness of breath.  . [DISCONTINUED] amoxicillin-clavulanate (AUGMENTIN) 875-125 MG per tablet Take 1  tablet by mouth 2 (two) times daily.  . [DISCONTINUED] fluticasone (FLONASE) 50 MCG/ACT nasal spray Place 2 sprays into the nose daily.  . [DISCONTINUED] mupirocin ointment (BACTROBAN) 2 % Apply to inside of each nares daily for 10 days then twice a week for maintenance.  . [DISCONTINUED] phentermine 37.5 MG capsule Take 1 capsule (37.5 mg total) by mouth every morning.          Objective:   Physical Exam  Constitutional: He is oriented to person, place, and time. He appears well-developed and well-nourished.  HENT:  Head: Normocephalic and atraumatic.  Right Ear: External ear normal.  Left Ear: External ear normal.  Nose: Nose normal.  Mouth/Throat: Oropharynx is clear and moist.  TMs and canals are clear. Nasal ulcer on the left side laterally. Nitroglycerin swab applied for cautery.  Eyes: Conjunctivae and EOM are normal. Pupils are equal,  round, and reactive to light.  Neck: Neck supple. No thyromegaly present.  Cardiovascular: Normal rate and normal heart sounds.   Pulmonary/Chest: Effort normal and breath sounds normal.  Lymphadenopathy:    He has no cervical adenopathy.  Neurological: He is alert and oriented to person, place, and time.  Skin: Skin is warm and dry.  Psychiatric: He has a normal mood and affect.          Assessment & Plan:  Epistaxis-cauterized what I felt like was likely the culprit for the bleeding. He did have a nasal ulcer on the left side. We also discussed the importance of getting his nasal passages more moisturize. The Allegra can be somewhat drying but he is also having diffuse itching and allergy symptoms so for now I want him to continue the Allegra. Avoid any nasal sprays currently. He's not currently using any. We'll have him apply mupirocin ointment at bedtime and use an over-the-counter product called nasal Ayr in the morning. Also recommended he buy an over-the-counter humidifier. Cool or warm this is fine. Encouraged him to run this in the bedroom for Lasix 8 hours at night. If she's not improving or has recurrent nosebleeds then please give me a call back.  Eczema-he is not using any type of lotion. Gave him a sample of Cetaphil for eczema. He definitely needs to start using an over-the-counter lotion that is fragrance free and dye free. Also make sure taking warm showers and avoid hot water which can be even more drying to the skin. Pat dry then apply lotion. Okay to continue the Allegra for now.  Eustachian tube dysfunction-right greater than left. Certainly he could consider seeing an ear nose and throat specialist again. He did get some relief with one of the nasal antihistamine to right now I think we need to get the dryness and the ulceration healed before we add any type of nasal spray back to his regimen. We can certainly restart it in a couple weeks if he is doing better. Continue  Allegra.  BMI32/Obesity-he is on fantastic job. He's lost a little over 30 pounds. I would like to see him lose about 20 more pounds. Encourage regular exercise in addition to his dietary changes.

## 2013-12-29 ENCOUNTER — Telehealth: Payer: Self-pay | Admitting: *Deleted

## 2013-12-29 NOTE — Telephone Encounter (Signed)
Pt called upset that the generic Bactroban was sent to [facility] and then you cancelled after you realized where you sent it to [facility] Pt is upset that if it comes to him that he will be charged over $300. He states his wife has cancelled it but that if it still comes to his house that he will not pay for that. I informed him that you done it in error. He is still upset and accusing the staff of sending the medication. [facility]. He wasn't satisfied with the fact that I told him I would let you know his concerns.  Meyer CoryMisty Alixis Harmon, LPN

## 2013-12-29 NOTE — Telephone Encounter (Signed)
Thank you this was definitely an accident.  I am not sure why [facility] would charge him $300 for a generic tube of bactroban when it is $25 dollars at a local pharmacy.  [facility] If it does arrive them please tell him to call us.

## 2014-01-03 ENCOUNTER — Telehealth: Payer: Self-pay | Admitting: *Deleted

## 2014-01-03 NOTE — Telephone Encounter (Addendum)
Pt came by office today with meds that were sent to his home in error. Pt was very upset due to the charge that was placed on his account. Informed pt that he can send the meds back to the pharmacy he stated that he was told that he could not do this. I told him that as long as he hadn't opened the package he could. I took the package and told pt that I would take care of this for him. Spoke w/Dr. Linford ArnoldMetheney about this and she stated that we could write return to sender on the package which was done and was given to Bailey MechJennifer Todd to place in mail for return. Pt did inform that he was able to get the money back on his card however he was very upset with the mix up. Deno Etienne.Nashaun Hillmer L Avarose Mervine

## 2014-03-11 ENCOUNTER — Telehealth: Payer: Self-pay

## 2014-03-11 NOTE — Telephone Encounter (Signed)
Patient advised.

## 2014-03-11 NOTE — Telephone Encounter (Signed)
I would stay on full dose of lisinopril. Can get dizzy with BP being too high.

## 2014-03-11 NOTE — Telephone Encounter (Signed)
Take blood pressure and see how low it is. Ok to wait until Monday. However, remember warning signs of something more serious and please go to ER with extreme weakness, or symptoms you discusses. Make sure drinking enough fluids could be dehydrated. Push fluids over next couple of days.

## 2014-03-11 NOTE — Telephone Encounter (Signed)
Patient's blood pressure is 158/79 with a pulse 57. Patient advised to watch out for symptoms listed below. He states he will and will also increase his fluid intake.

## 2014-03-11 NOTE — Telephone Encounter (Signed)
Lance Hernandez complains of lightheadedness and weight loss for a couple of weeks. Denies nausea, vomiting or vision problems. He cut his lisinopril in half. He has a scheduled appointment with Dr Linford Arnold on Monday. I advised him to go to the ED if he has sudden changes in his vision, nausea, vomiting, speech problems or weakness in his face muscles or arms and hands. Please advise if patient needs an earlier evaluation.

## 2014-03-14 ENCOUNTER — Ambulatory Visit (INDEPENDENT_AMBULATORY_CARE_PROVIDER_SITE_OTHER): Payer: 59 | Admitting: Family Medicine

## 2014-03-14 ENCOUNTER — Encounter: Payer: Self-pay | Admitting: Family Medicine

## 2014-03-14 VITALS — BP 124/63 | HR 85 | Wt 186.0 lb

## 2014-03-14 DIAGNOSIS — I10 Essential (primary) hypertension: Secondary | ICD-10-CM

## 2014-03-14 DIAGNOSIS — L719 Rosacea, unspecified: Secondary | ICD-10-CM | POA: Insufficient documentation

## 2014-03-14 DIAGNOSIS — I498 Other specified cardiac arrhythmias: Secondary | ICD-10-CM

## 2014-03-14 DIAGNOSIS — R001 Bradycardia, unspecified: Secondary | ICD-10-CM

## 2014-03-14 MED ORDER — METRONIDAZOLE 0.75 % EX CREA: TOPICAL_CREAM | Freq: Two times a day (BID) | CUTANEOUS | Status: DC

## 2014-03-14 NOTE — Progress Notes (Signed)
   Subjective:    Patient ID: Lance Hernandez, male    DOB: 02/09/1956, 58 y.o.   MRN: [id number]  HPI Hypertension- Pt denies chest pain, SOB, dizziness, or heart palpitations.  Taking meds as directed w/o problems.  Denies medication side effects.  Was getting dizzy a few weeks ago so thought BP was going lo so cut the BP in half and then finally stopped it for about 4-5 days. Then resarted it about 3 days ago.  Says HR was dropping into his his 30s and the following day he reports that the home blood pressure cuff says that his pulse was in the 40s.  He has really been working hard to lose weight and thought that it was his blood pressure going low because of the weight loss. He's actually felt little bit better since restarting the blood pressure pill but still feels even a little dizzy today.  Rosacea-he brought an old tube of ointment that was given to him by his oncologist. He says he did not use all refills but is now due for refill and wants to know how would be willing to take over this prescription.  He still having some significant problems with his hearing. I like recommendation for new ears nose and throat specialist. Review of Systems     Objective:   Physical Exam  Constitutional: He is oriented to person, place, and time. He appears well-developed and well-nourished.  HENT:  Head: Normocephalic and atraumatic.  Cardiovascular: Normal rate, regular rhythm and normal heart sounds.   Pulmonary/Chest: Effort normal and breath sounds normal.  Neurological: He is alert and oriented to person, place, and time.  Skin: Skin is warm and dry.  Psychiatric: He has a normal mood and affect. His behavior is normal.          Assessment & Plan:  HTN- Well controlled. Will decrease the lisinopril to 10mg .  think ahead and cut tab in half. The blood pressure looks great and the diastolic is getting a little bit low. He really has lost a significant amount of weight almost 30  pounds.  Bradycardia-pulses within the normal range today. He did go ahead and get an EKG. Encouraged him to manually check his pulse when his blood pressure cuff says it did slow to verify its accuracy. He seems a little bit hesitant unsure on how to check a pulse. I showed him to do so but he did not seem very confident about it. Would consider full cardiac monitoring to try to capture episodes of low heart rate as well as dizziness and lightheadedness. EKG shows rate of 59 beats per minute, normal sinus rhythm. Normal axis.  Rosacea-he would like a refill on the metronidazole cream given to him by his rheumatologist.

## 2014-03-15 ENCOUNTER — Encounter (INDEPENDENT_AMBULATORY_CARE_PROVIDER_SITE_OTHER): Payer: 59

## 2014-03-15 ENCOUNTER — Encounter: Payer: Self-pay | Admitting: *Deleted

## 2014-03-15 DIAGNOSIS — I495 Sick sinus syndrome: Secondary | ICD-10-CM

## 2014-03-15 DIAGNOSIS — R001 Bradycardia, unspecified: Secondary | ICD-10-CM

## 2014-03-15 NOTE — Progress Notes (Signed)
Patient ID: Lance Hernandez, male   DOB: 28-Sep-1956, 58 y.o.   MRN: [id number] E-Cardio Verite 30 day cardiac event monitor applied to patient.

## 2014-04-12 ENCOUNTER — Ambulatory Visit: Payer: 59 | Admitting: Family Medicine

## 2014-04-18 ENCOUNTER — Ambulatory Visit (INDEPENDENT_AMBULATORY_CARE_PROVIDER_SITE_OTHER): Payer: 59 | Admitting: Family Medicine

## 2014-04-18 ENCOUNTER — Encounter: Payer: Self-pay | Admitting: Family Medicine

## 2014-04-18 ENCOUNTER — Telehealth: Payer: Self-pay | Admitting: *Deleted

## 2014-04-18 VITALS — BP 132/72 | HR 73 | Wt 187.0 lb

## 2014-04-18 DIAGNOSIS — I10 Essential (primary) hypertension: Secondary | ICD-10-CM

## 2014-04-18 DIAGNOSIS — Z87438 Personal history of other diseases of male genital organs: Secondary | ICD-10-CM

## 2014-04-18 DIAGNOSIS — R42 Dizziness and giddiness: Secondary | ICD-10-CM

## 2014-04-18 DIAGNOSIS — E785 Hyperlipidemia, unspecified: Secondary | ICD-10-CM

## 2014-04-18 DIAGNOSIS — R7309 Other abnormal glucose: Secondary | ICD-10-CM

## 2014-04-18 LAB — POCT GLYCOSYLATED HEMOGLOBIN (HGB A1C): Hemoglobin A1C: 5.3

## 2014-04-18 NOTE — Telephone Encounter (Signed)
Pt called and would like for Dr. Linford ArnoldMetheney to place referral for cards Dr. Gwendolyn LimaBodek to [facility] instead of [facility]. Laureen Ochs.Teigen Bellin, Viann Shoveonya Lynetta

## 2014-04-18 NOTE — Telephone Encounter (Signed)
I will forward to [facility], care coordinator.

## 2014-04-18 NOTE — Progress Notes (Signed)
   Subjective:    Patient ID: Lance Hernandez, male    DOB: 01/14/1956, 58 y.o.   MRN: [id number]  HPI Hypertension- Pt denies chest pain, SOB, dizziness, or heart palpitations.  Taking meds as directed w/o problems.  Denies medication side effects.  He has lost 30 lbs and trying to maintain his weight.  We decreased his lisinopril to half a tab of 20 mg.  Had an episode of feeling lightheaded and dizzy about 3 weeks ago.Lasted about 1-2 minutes.  Felt tire.  No CP. Does have occ heart flutters but didn't happen when he had this episode.  No cough or fevers.  He also want to review this the results of his 30 day cardiac monitor. Overall still not feeling his usual self. He's also concerned about his lungs. He did smoke for about 2 weeks when he was younger. He was exposed to secondhand smoke during his childhood. He denies any significant shortness of breath but does get easily fatigued with activity.  Review of Systems     Objective:   Physical Exam  Constitutional: He is oriented to person, place, and time. He appears well-developed and well-nourished.  HENT:  Head: Normocephalic and atraumatic.  Cardiovascular: Normal rate, regular rhythm and normal heart sounds.   Pulmonary/Chest: Effort normal and breath sounds normal.  Neurological: He is alert and oriented to person, place, and time.  Skin: Skin is warm and dry.  Psychiatric: He has a normal mood and affect. His behavior is normal.          Assessment & Plan:  HTN - well controlled. He is on fantastic job in controlling his diet and losing weight. He is doing well on the decreased dose of lisinopril.  Lightheadedness-unclear if this is related to his episodes of bradycardia which was seen on the cardiac monitor. Unfortunately he was confused about the instructions about when to actually hit a button. So it's difficult to say if his symptoms necessarily correlated with the times when he was dropping into the brief bradycardia. I would  like to go ahead and refer him to cardiology for further evaluation.  Fatigue-will consider spirometry for further evaluation since he is concerned about his lung function.

## 2014-04-19 LAB — COMPLETE METABOLIC PANEL WITH GFR
ALBUMIN: 4.4 g/dL (ref 3.5–5.2)
ALT: 23 U/L (ref 0–53)
AST: 20 U/L (ref 0–37)
Alkaline Phosphatase: 59 U/L (ref 39–117)
BUN: 22 mg/dL (ref 6–23)
CALCIUM: 9.9 mg/dL (ref 8.4–10.5)
CHLORIDE: 104 meq/L (ref 96–112)
CO2: 26 mEq/L (ref 19–32)
Creat: 0.91 mg/dL (ref 0.50–1.35)
GLUCOSE: 102 mg/dL — AB (ref 70–99)
Potassium: 4.3 mEq/L (ref 3.5–5.3)
Sodium: 140 mEq/L (ref 135–145)
Total Bilirubin: 0.8 mg/dL (ref 0.2–1.2)
Total Protein: 7.6 g/dL (ref 6.0–8.3)

## 2014-04-19 LAB — LIPID PANEL
CHOLESTEROL: 172 mg/dL (ref 0–200)
HDL: 65 mg/dL (ref >39)
LDL Cholesterol: 84 mg/dL (ref 0–99)
Total CHOL/HDL Ratio: 2.6 Ratio
Triglycerides: 114 mg/dL (ref <150)
VLDL: 23 mg/dL (ref 0–40)

## 2014-04-19 LAB — TSH: TSH: 2.204 u[IU]/mL (ref 0.350–4.500)

## 2014-04-19 LAB — PSA: PSA: 1.51 ng/mL (ref <=4.00)

## 2014-05-25 ENCOUNTER — Encounter: Payer: Self-pay | Admitting: Family Medicine

## 2014-05-25 ENCOUNTER — Ambulatory Visit: Payer: 59 | Admitting: Family Medicine

## 2014-05-25 VITALS — BP 127/79 | HR 55 | Ht 65.25 in | Wt 191.0 lb

## 2014-05-25 DIAGNOSIS — Z7722 Contact with and (suspected) exposure to environmental tobacco smoke (acute) (chronic): Secondary | ICD-10-CM

## 2014-05-25 MED ORDER — ALBUTEROL SULFATE (2.5 MG/3ML) 0.083% IN NEBU
2.5000 mg | INHALATION_SOLUTION | Freq: Once | RESPIRATORY_TRACT | Status: AC
  Administered 2014-05-25: 2.5 mg via RESPIRATORY_TRACT

## 2014-05-25 NOTE — Progress Notes (Signed)
   Subjective:    Patient ID: Lance Hernandez, male    DOB: 07/23/1956, 58 y.o.   MRN: [id number]  HPI He is here today for spirometry. He has come in complaining of symptoms including fatigue and dizziness. He wore a heart monitor but wasn't very clear on when to hit a button when he was feeling symptoms so often when he felt symptomatic he did not hit the button to record. He just feels overly tired. He has been working hard to lose weight. He smoked very briefly as a young adult. He did have exposure to secondhand smoke. He feels like a lot of his symptoms are related to his lung functions I asked him to come in today for spirometry for further evaluation.   Review of Systems     Objective:   Physical Exam        Assessment & Plan:  Unfortunately we were having difficulty with her computer system today and we are unsuccessful at getting adequate results for his spirometry. He will return later next month for further evaluation repeat exam. No charge for today and no charge for next office visit because of inconvenience to the patient.

## 2014-06-08 ENCOUNTER — Other Ambulatory Visit: Payer: Self-pay | Admitting: Family Medicine

## 2014-06-24 ENCOUNTER — Other Ambulatory Visit: Payer: Self-pay | Admitting: Family Medicine

## 2014-07-08 ENCOUNTER — Ambulatory Visit (INDEPENDENT_AMBULATORY_CARE_PROVIDER_SITE_OTHER): Payer: 59 | Admitting: Family Medicine

## 2014-07-08 ENCOUNTER — Encounter: Payer: Self-pay | Admitting: Family Medicine

## 2014-07-08 VITALS — BP 113/66 | HR 55 | Wt 197.0 lb

## 2014-07-08 DIAGNOSIS — Z23 Encounter for immunization: Secondary | ICD-10-CM

## 2014-07-08 DIAGNOSIS — R5381 Other malaise: Secondary | ICD-10-CM

## 2014-07-08 DIAGNOSIS — J449 Chronic obstructive pulmonary disease, unspecified: Secondary | ICD-10-CM

## 2014-07-08 DIAGNOSIS — R5383 Other fatigue: Secondary | ICD-10-CM

## 2014-07-08 MED ORDER — PRAVASTATIN SODIUM 40 MG PO TABS: 40.0000 mg | ORAL_TABLET | Freq: Every day | ORAL | Status: DC

## 2014-07-08 MED ORDER — ALBUTEROL SULFATE 108 (90 BASE) MCG/ACT IN AEPB: 2.0000 | INHALATION_SPRAY | Freq: Four times a day (QID) | RESPIRATORY_TRACT | Status: DC

## 2014-07-08 MED ORDER — LISINOPRIL 20 MG PO TABS: 20.0000 mg | ORAL_TABLET | Freq: Every day | ORAL | Status: DC

## 2014-07-08 NOTE — Progress Notes (Signed)
   Subjective:    Patient ID: Lance Hernandez, male    DOB: Feb 03, 1956, 58 y.o.   MRN: [id number]  HPI Here today for spirometry. Please see previous note for detail. Had technical difficulty with the machine so had him return today free of charge.     Review of Systems     Objective:   Physical Exam        Assessment & Plan:  Mild COPD - Dicussed dx and results from spirometry.  Only have post test results as had error with new machine again today. .  Will repeat test in one year.  Flu vaccine given.  Recommend albuterol prn and discussed how to use the inhaler. Demonstrated how to use.

## 2014-07-18 DIAGNOSIS — R001 Bradycardia, unspecified: Secondary | ICD-10-CM | POA: Insufficient documentation

## 2015-02-10 ENCOUNTER — Other Ambulatory Visit: Payer: Self-pay | Admitting: Family Medicine

## 2015-02-26 ENCOUNTER — Other Ambulatory Visit: Payer: Self-pay | Admitting: Family Medicine

## 2015-05-02 ENCOUNTER — Ambulatory Visit (INDEPENDENT_AMBULATORY_CARE_PROVIDER_SITE_OTHER): Payer: 59 | Admitting: Family Medicine

## 2015-05-02 ENCOUNTER — Encounter: Payer: Self-pay | Admitting: Family Medicine

## 2015-05-02 VITALS — BP 152/83 | HR 75 | Ht 66.75 in | Wt 227.0 lb

## 2015-05-02 DIAGNOSIS — M5431 Sciatica, right side: Secondary | ICD-10-CM

## 2015-05-02 DIAGNOSIS — N400 Enlarged prostate without lower urinary tract symptoms: Secondary | ICD-10-CM

## 2015-05-02 DIAGNOSIS — E785 Hyperlipidemia, unspecified: Secondary | ICD-10-CM

## 2015-05-02 DIAGNOSIS — Z114 Encounter for screening for human immunodeficiency virus [HIV]: Secondary | ICD-10-CM

## 2015-05-02 DIAGNOSIS — R1310 Dysphagia, unspecified: Secondary | ICD-10-CM

## 2015-05-02 DIAGNOSIS — I10 Essential (primary) hypertension: Secondary | ICD-10-CM | POA: Diagnosis not present

## 2015-05-02 DIAGNOSIS — R7309 Other abnormal glucose: Secondary | ICD-10-CM

## 2015-05-02 DIAGNOSIS — Z87438 Personal history of other diseases of male genital organs: Secondary | ICD-10-CM | POA: Diagnosis not present

## 2015-05-02 DIAGNOSIS — M419 Scoliosis, unspecified: Secondary | ICD-10-CM

## 2015-05-02 DIAGNOSIS — Z1159 Encounter for screening for other viral diseases: Secondary | ICD-10-CM

## 2015-05-02 LAB — COMPLETE METABOLIC PANEL WITH GFR
ALT: 28 U/L (ref 0–53)
AST: 20 U/L (ref 0–37)
Albumin: 4.3 g/dL (ref 3.5–5.2)
Alkaline Phosphatase: 42 U/L (ref 39–117)
BUN: 25 mg/dL — AB (ref 6–23)
CO2: 24 meq/L (ref 19–32)
Calcium: 9.7 mg/dL (ref 8.4–10.5)
Chloride: 105 mEq/L (ref 96–112)
Creat: 0.84 mg/dL (ref 0.50–1.35)
GFR, Est Non African American: >89 mL/min
Glucose, Bld: 93 mg/dL (ref 70–99)
POTASSIUM: 4 meq/L (ref 3.5–5.3)
SODIUM: 142 meq/L (ref 135–145)
Total Bilirubin: 1.1 mg/dL (ref 0.2–1.2)
Total Protein: 7.4 g/dL (ref 6.0–8.3)

## 2015-05-02 LAB — LIPID PANEL
CHOL/HDL RATIO: 3 ratio
Cholesterol: 197 mg/dL (ref 0–200)
HDL: 66 mg/dL (ref >=40)
LDL CALC: 107 mg/dL — AB (ref 0–99)
TRIGLYCERIDES: 119 mg/dL (ref <150)
VLDL: 24 mg/dL (ref 0–40)

## 2015-05-02 LAB — POCT GLYCOSYLATED HEMOGLOBIN (HGB A1C): Hemoglobin A1C: 5.8

## 2015-05-02 LAB — TSH: TSH: 1.52 u[IU]/mL (ref 0.350–4.500)

## 2015-05-02 MED ORDER — MELOXICAM 15 MG PO TABS: 15.0000 mg | ORAL_TABLET | Freq: Every day | ORAL | Status: DC

## 2015-05-02 MED ORDER — TRAMADOL HCL 50 MG PO TABS: 50.0000 mg | ORAL_TABLET | Freq: Every day | ORAL | Status: DC

## 2015-05-02 MED ORDER — PREDNISONE 20 MG PO TABS: 40.0000 mg | ORAL_TABLET | Freq: Every day | ORAL | Status: DC

## 2015-05-02 NOTE — Patient Instructions (Signed)
Take the prednisone for 5 days.  After that can start the meloxicam.  If not better in 3 week then let me know.

## 2015-05-02 NOTE — Progress Notes (Signed)
   Subjective:    Patient ID: Lance Hernandez, male    DOB: 09/01/1956, 59 y.o.   MRN: [id number]  HPI Low right sided back pain x 1wk he has been using tylenol, advil 600mg  , icy hot, cold and warm compresses. he has Hx of scoliosis. he stated the pain shooting pain down his R leg to his knee. No known injury.   Has emotionally had a rough year as his wife has been treating her cancer. He admits he really has not been taking the best care of himself. He's been eating more than usual and has not been going to the gym. He is upset because he has gained a fair amount of weight. He had some questions about some over-the-counter supplements to help him lose weight.   Food is getting hung in his upper chest for about 6 months. Has been happening more often. He has not noticed any choking or swallowing difficulty. No recent heartburn etc. He is never had problems with esophageal strictures before.  He would like his glucose checked again today. He still very fearful that he may have diabetes. Review of Systems     Objective:   Physical Exam  Constitutional: He is oriented to person, place, and time. He appears well-developed and well-nourished.  HENT:  Head: Normocephalic and atraumatic.  Musculoskeletal: He exhibits no edema.  Normal lumbar flexion, extension, rotation right and left, side bending. That he did have more discomfort with extension and with rotation and side bending to the right. Positive straight leg raise on the right. Negative on the left. Hip, knee, ankle strength is 5 out of 5 bilaterally. Nontender over the lumbar spine or paraspinous muscles. Nontender over the SI joints.  Neurological: He is alert and oriented to person, place, and time.  Skin: Skin is warm.  Psychiatric: He has a normal mood and affect. His behavior is normal.          Assessment & Plan:  Right sciatica - discussed treatment. Since he has not gotten any relief with over-the-counter medications we'll give  him prednisone for 5 days. After that he can start meloxicam. Make sure take with food and water and stop immediately if any GI upset or irritation. Will give him a handout on some stretches to start later this week. Call if not significantly improved over the next 3-4 weeks.  Dysphagia - recommend referral to gastroenterology for endoscopy. He could certainly have an esophageal stricture. Tumor would be less likely as he is overall low risk. Next  Weight gain-encouraged him to get back on track with diet and exercise. He really hasn't been to the gym much  IFG - A1c is up to 5.8 today, previous was 5.3. Discussed getting back on track with diet and exercise. We can recheck A1c in 6 months.

## 2015-05-03 LAB — PSA: PSA: 1.17 ng/mL (ref <=4.00)

## 2015-05-03 LAB — HEPATITIS C ANTIBODY: HCV AB: NEGATIVE

## 2015-05-03 LAB — HIV ANTIBODY (ROUTINE TESTING W REFLEX): HIV 1&2 Ab, 4th Generation: NONREACTIVE

## 2015-05-04 ENCOUNTER — Telehealth: Payer: Self-pay

## 2015-05-04 NOTE — Telephone Encounter (Addendum)
Colter reports a rash all over and believes one of the new medications is the cause. Denies shortness of breath or mouth swelling. Please advise.

## 2015-05-04 NOTE — Telephone Encounter (Signed)
It could be the tramadol warm meloxicam. Go ahead and stop both products. Recommend Benadryl for any itching or redness. Recommend oatmeal baths for soothing the skin. He is welcome to come in for [place] to evaluate the rash. See if we can put him on Dr. Denyse Amass schedule for tomorrow or with me if availability.

## 2015-05-05 NOTE — Telephone Encounter (Signed)
Left detailed message and for a return call.  

## 2015-05-08 ENCOUNTER — Ambulatory Visit (INDEPENDENT_AMBULATORY_CARE_PROVIDER_SITE_OTHER): Payer: 59

## 2015-05-08 ENCOUNTER — Encounter: Payer: Self-pay | Admitting: Family Medicine

## 2015-05-08 ENCOUNTER — Ambulatory Visit (INDEPENDENT_AMBULATORY_CARE_PROVIDER_SITE_OTHER): Payer: 59 | Admitting: Family Medicine

## 2015-05-08 VITALS — BP 129/82 | HR 66 | Ht 66.0 in | Wt 224.0 lb

## 2015-05-08 DIAGNOSIS — L270 Generalized skin eruption due to drugs and medicaments taken internally: Secondary | ICD-10-CM | POA: Diagnosis not present

## 2015-05-08 DIAGNOSIS — M5431 Sciatica, right side: Secondary | ICD-10-CM

## 2015-05-08 DIAGNOSIS — M2578 Osteophyte, vertebrae: Secondary | ICD-10-CM

## 2015-05-08 MED ORDER — CYCLOBENZAPRINE HCL 10 MG PO TABS: 10.0000 mg | ORAL_TABLET | Freq: Every evening | ORAL | Status: DC | PRN

## 2015-05-08 MED ORDER — MELOXICAM 15 MG PO TABS: 15.0000 mg | ORAL_TABLET | Freq: Every day | ORAL | Status: DC

## 2015-05-08 NOTE — Progress Notes (Signed)
   Subjective:    Patient ID: Lance Hernandez, male    DOB: 11-24-1955, 59 y.o.   MRN: [id number]  HPI Right sided sciatica.  Saw him for this about 6 days ago.  Says feels like some twinges in his right leg  And feels like his sensation is altered on his right lower leg.  Took the prednisone and helped some.  Then took tramadol and had a rash so stopped it.  Pain is moderate but not severe.  He definitely is some better but not completely and is concerned.   Review of Systems     Objective:   Physical Exam  Constitutional: He is oriented to person, place, and time. He appears well-developed and well-nourished.  HENT:  Head: Normocephalic and atraumatic.  Musculoskeletal:  Lumbar spine with normal flexion, extension, rotation right and left and side bending. Nontender over the SI joints. Some discomfort with right straight leg raise but no recurrence of symptoms. Negative straight leg raise. Hip, knee, ankle strength is 5 out of 5 bilaterally. Patellar reflexes are symmetric.  Neurological: He is alert and oriented to person, place, and time.  Skin: Rash noted.  Some scattered erythematous papules along the left inner leg and right upper thigh.  Psychiatric: He has a normal mood and affect. His behavior is normal.          Assessment & Plan:  Right sided sciatica - discussed diagnosis. Can go ahead and move forward with x-rays of the lumbar spine. They were last done in 2013 since his pain has persisted. Okay to go ahead and start the meloxicam. Hopefully he will do fine with it. Also add muscle relaxer in the evenings as needed. Given handout on stretches for his low back to start doing on his own at home and really work on hamstring stretches since his hamstrings are very tight. Next  Drug reaction-he did develop a rash and there still evidence of it on exam today from the tramadol. Added to his allergy list.

## 2015-05-13 ENCOUNTER — Other Ambulatory Visit: Payer: Self-pay | Admitting: Family Medicine

## 2015-05-27 ENCOUNTER — Other Ambulatory Visit: Payer: Self-pay | Admitting: Family Medicine

## 2015-08-24 ENCOUNTER — Encounter: Payer: Self-pay | Admitting: Family Medicine

## 2015-11-11 ENCOUNTER — Other Ambulatory Visit: Payer: Self-pay | Admitting: Family Medicine

## 2015-11-14 ENCOUNTER — Telehealth: Payer: Self-pay | Admitting: Family Medicine

## 2015-11-14 NOTE — Telephone Encounter (Signed)
I called patient and informed him that he will need another f/u appointment on his BP in order to get any refills. Thanks, [employer]

## 2016-02-09 ENCOUNTER — Other Ambulatory Visit: Payer: Self-pay | Admitting: Family Medicine

## 2016-04-08 ENCOUNTER — Ambulatory Visit (INDEPENDENT_AMBULATORY_CARE_PROVIDER_SITE_OTHER): Payer: 59 | Admitting: Physician Assistant

## 2016-04-08 ENCOUNTER — Encounter: Payer: Self-pay | Admitting: Physician Assistant

## 2016-04-08 ENCOUNTER — Ambulatory Visit (INDEPENDENT_AMBULATORY_CARE_PROVIDER_SITE_OTHER): Payer: 59

## 2016-04-08 VITALS — BP 156/82 | HR 96 | Ht 66.0 in | Wt 227.0 lb

## 2016-04-08 DIAGNOSIS — R059 Cough, unspecified: Secondary | ICD-10-CM

## 2016-04-08 DIAGNOSIS — J441 Chronic obstructive pulmonary disease with (acute) exacerbation: Secondary | ICD-10-CM | POA: Diagnosis not present

## 2016-04-08 DIAGNOSIS — R062 Wheezing: Secondary | ICD-10-CM | POA: Diagnosis not present

## 2016-04-08 DIAGNOSIS — R0602 Shortness of breath: Secondary | ICD-10-CM

## 2016-04-08 DIAGNOSIS — R05 Cough: Secondary | ICD-10-CM | POA: Diagnosis not present

## 2016-04-08 MED ORDER — PREDNISONE 20 MG PO TABS: ORAL_TABLET | ORAL | Status: DC

## 2016-04-08 MED ORDER — IPRATROPIUM-ALBUTEROL 0.5-2.5 (3) MG/3ML IN SOLN
3.0000 mL | Freq: Once | RESPIRATORY_TRACT | Status: AC
  Administered 2016-04-08: 3 mL via RESPIRATORY_TRACT

## 2016-04-08 MED ORDER — HYDROCODONE-HOMATROPINE 5-1.5 MG/5ML PO SYRP: 5.0000 mL | ORAL_SOLUTION | Freq: Every evening | ORAL | Status: DC | PRN

## 2016-04-08 MED ORDER — PRAVASTATIN SODIUM 40 MG PO TABS: ORAL_TABLET | ORAL | Status: DC

## 2016-04-08 MED ORDER — ALBUTEROL SULFATE 108 (90 BASE) MCG/ACT IN AEPB: 2.0000 | INHALATION_SPRAY | Freq: Four times a day (QID) | RESPIRATORY_TRACT | Status: DC

## 2016-04-08 MED ORDER — LISINOPRIL 20 MG PO TABS: ORAL_TABLET | ORAL | Status: DC

## 2016-04-08 NOTE — Progress Notes (Addendum)
   Subjective:    Patient ID: Lance Hernandez, male    DOB: 03/27/1956, 60 y.o.   MRN: [id number]  HPI  Pt is a 60 yo male who presents to the clinic with a productive cough for 3 days. He has coughed so much his ribs are sore. He has been diagnosed with mild COPD due to secondhand smoke exposure. He feels like even before cough he was more SOB and weaker than normal for the last 6 months. He uses respiclick but does not feel like helps. No fever, chills, sinus pressure, ST, or ear pain. He has had a runny nose.    Review of Systems  All other systems reviewed and are negative.      Objective:   Physical Exam  Constitutional: He is oriented to person, place, and time. He appears well-developed and well-nourished.  HENT:  Head: Normocephalic and atraumatic.  Right Ear: External ear normal.  Left Ear: External ear normal.  Nose: Nose normal.  Mouth/Throat: Oropharynx is clear and moist.  Eyes: Conjunctivae are normal.  Neck: Normal range of motion. Neck supple.  Cardiovascular: Normal rate, regular rhythm and normal heart sounds.   Pulmonary/Chest: He has wheezes.  Right wheezing throughout lung.  No rhonchi.  Lymphadenopathy:    He has no cervical adenopathy.  Neurological: He is alert and oriented to person, place, and time.  Skin: Skin is dry.  Psychiatric: He has a normal mood and affect. His behavior is normal.          Assessment & Plan:  COPd exacerbation/SOB- reassured patient I do not see any bacterial causes of symptoms. duoneb in office given and pt admitted to feeling more "open" and able to "cough up stuff". Prednisone taper and hycodan given for cough at bedtime. Will get CXR today to confirm dx and no pneumonia starting. Continue albuterol inhaler as needed every 2-4 hours.   SOB- since ongoing for last 6 months may need to consider daily inhaler spiriva or incruse. Follow up with [facility] in 2-4 weeks to discuss. Likely worsening COPD.

## 2016-04-10 ENCOUNTER — Telehealth: Payer: Self-pay | Admitting: *Deleted

## 2016-04-10 NOTE — Telephone Encounter (Signed)
Pt left vm stating that he thinks the prednisone you gave him on Monday is giving him a rash.  He said that he is probably going to stop taking it today & wanted to know if there is anything else that you might could give him.

## 2016-04-10 NOTE — Telephone Encounter (Signed)
We could go ahead and start a daily inhaler. Would you like to do that? How are you upper respiratory symptoms?

## 2016-04-11 MED ORDER — GUAIFENESIN-CODEINE 100-10 MG/5ML PO SOLN: 5.0000 mL | Freq: Two times a day (BID) | ORAL | Status: DC | PRN

## 2016-04-11 NOTE — Telephone Encounter (Signed)
[facility]   Patient agreed to getting the daily inhaler. He is having some SOB. He states the cough medication is not working and would like a different cough medication.

## 2016-04-11 NOTE — Telephone Encounter (Signed)
Lance Hernandez states the cough syrup he picked up will not help. He would like hycodan. Please advise.

## 2016-04-11 NOTE — Telephone Encounter (Signed)
I am very confused. There was Lance Hernandez prescription and the system from 2 days ago for Hycodan syrup. I thought he was specifically wanting codeine cough syrup versus hydrocodone cough syrup. This should Lance Hernandez be a prescription for him. I don't know if it was sent or if it was given to him during his office visit I am not sure.

## 2016-04-11 NOTE — Telephone Encounter (Signed)
Prescription for inhaler was Lance Hernandez sent on the 26th [facility] New prescription printed for codeine cough syrup. If not effective then there really aren't any other choices he is just going to have to do conservative treatment. Certainly if he feels like he is getting worse we can always do a chest x-ray if needed. Okay to stop the prednisone.

## 2016-04-12 NOTE — Telephone Encounter (Signed)
Medication declined. He reported that he's actually been taking it twice a day. He has says he is also been taking more than 5 mLs at a time. Even if he was taking it twice a day if taking appropriately he should have enough for 12 days. We are not want to refill the Hycodan at this time. He can  call us back on Monday if he feels like his symptoms are still persistent.

## 2016-04-12 NOTE — Telephone Encounter (Signed)
Sorry, he states he needs a refill on the Hydodan.

## 2016-04-23 ENCOUNTER — Encounter: Payer: Self-pay | Admitting: Family Medicine

## 2016-04-23 ENCOUNTER — Ambulatory Visit (INDEPENDENT_AMBULATORY_CARE_PROVIDER_SITE_OTHER): Payer: 59 | Admitting: Family Medicine

## 2016-04-23 VITALS — BP 130/81 | HR 78 | Wt 226.0 lb

## 2016-04-23 DIAGNOSIS — R42 Dizziness and giddiness: Secondary | ICD-10-CM

## 2016-04-23 DIAGNOSIS — R05 Cough: Secondary | ICD-10-CM

## 2016-04-23 DIAGNOSIS — R001 Bradycardia, unspecified: Secondary | ICD-10-CM | POA: Diagnosis not present

## 2016-04-23 DIAGNOSIS — R0602 Shortness of breath: Secondary | ICD-10-CM | POA: Diagnosis not present

## 2016-04-23 DIAGNOSIS — R7309 Other abnormal glucose: Secondary | ICD-10-CM

## 2016-04-23 DIAGNOSIS — R059 Cough, unspecified: Secondary | ICD-10-CM

## 2016-04-23 LAB — LIPID PANEL
CHOL/HDL RATIO: 3.3 ratio (ref <=5.0)
Cholesterol: 213 mg/dL — ABNORMAL HIGH (ref 125–200)
HDL: 64 mg/dL (ref >=40)
LDL Cholesterol: 124 mg/dL (ref <130)
Triglycerides: 124 mg/dL (ref <150)
VLDL: 25 mg/dL (ref <30)

## 2016-04-23 LAB — CBC WITH DIFFERENTIAL/PLATELET
BASOS ABS: 0 {cells}/uL (ref 0–200)
Basophils Relative: 0 %
EOS PCT: 3 %
Eosinophils Absolute: 168 cells/uL (ref 15–500)
HEMATOCRIT: 45.8 % (ref 38.5–50.0)
HEMOGLOBIN: 15.7 g/dL (ref 13.2–17.1)
LYMPHS ABS: 1568 {cells}/uL (ref 850–3900)
LYMPHS PCT: 28 %
MCH: 31.6 pg (ref 27.0–33.0)
MCHC: 34.3 g/dL (ref 32.0–36.0)
MCV: 92.2 fL (ref 80.0–100.0)
MPV: 10 fL (ref 7.5–12.5)
Monocytes Absolute: 392 cells/uL (ref 200–950)
Monocytes Relative: 7 %
NEUTROS PCT: 62 %
Neutro Abs: 3472 cells/uL (ref 1500–7800)
Platelets: 205 10*3/uL (ref 140–400)
RBC: 4.97 MIL/uL (ref 4.20–5.80)
RDW: 13.3 % (ref 11.0–15.0)
WBC: 5.6 10*3/uL (ref 3.8–10.8)

## 2016-04-23 LAB — COMPLETE METABOLIC PANEL WITH GFR
ALBUMIN: 4.4 g/dL (ref 3.6–5.1)
ALK PHOS: 48 U/L (ref 40–115)
ALT: 40 U/L (ref 9–46)
AST: 26 U/L (ref 10–35)
BUN: 17 mg/dL (ref 7–25)
CHLORIDE: 102 mmol/L (ref 98–110)
CO2: 28 mmol/L (ref 20–31)
Calcium: 9.3 mg/dL (ref 8.6–10.3)
Creat: 0.93 mg/dL (ref 0.70–1.33)
GFR, Est African American: >89 mL/min (ref >=60)
GLUCOSE: 93 mg/dL (ref 65–99)
POTASSIUM: 4.5 mmol/L (ref 3.5–5.3)
SODIUM: 138 mmol/L (ref 135–146)
Total Bilirubin: 0.9 mg/dL (ref 0.2–1.2)
Total Protein: 7.1 g/dL (ref 6.1–8.1)

## 2016-04-23 LAB — TSH: TSH: 2.11 m[IU]/L (ref 0.40–4.50)

## 2016-04-23 MED ORDER — TIOTROPIUM BROMIDE MONOHYDRATE 1.25 MCG/ACT IN AERS: 2.0000 | INHALATION_SPRAY | Freq: Every day | RESPIRATORY_TRACT | Status: DC

## 2016-04-23 MED ORDER — LOSARTAN POTASSIUM 50 MG PO TABS: 50.0000 mg | ORAL_TABLET | Freq: Every day | ORAL | Status: DC

## 2016-04-23 NOTE — Progress Notes (Addendum)
Subjective:    CC: Two week follow-up for COPD exacerbation  HPI: Patient was seen in our office by another provider for productive cough 3 days with some shortness of breath. He was recently diagnosed with mild COPD due to secondhand smoke exposure. He felt like he was short of breath and weak.  He was given DuoNeb in the office and felt better. He is given prednisone taper and Hycodan syrup. Chest x-ray was obtained. It was negative for pneumonia but did confirm COPD.   He admits he's actually felt more short of breath over the last 8-9 months in general. Could consider starting an anticholinergic. Has been having episodes where he feels weak and lightheaded. He denies any significant chest pain. He says occasionally he'll get a little pinching sensation that last just for a minute.  No known triggers.  Past medical history, Surgical history, Family history not pertinant except as noted below, Social history, Allergies, and medications have been entered into the medical record, reviewed, and corrections made.   Review of Systems: No fevers, chills, night sweats, weight loss, chest pain..   Note, his wife is here with him today.  Objective:    General: Well Developed, well nourished, and in no acute distress.  Neuro: Alert and oriented x3, extra-ocular muscles intact, sensation grossly intact.  HEENT: Normocephalic, atraumatic  Skin: Warm and dry, no rashes. Cardiac: Regular rate and rhythm, no murmurs rubs or gallops, no lower extremity edema.  Respiratory: Clear to auscultation bilaterally. Not using accessory muscles, speaking in full sentences.   Impression and Recommendations:   SOB/cough - suspect COPD exacerbation though he should be feeling a little bit better by now. Chest x-ray was negative for any type of pneumonia or significant bronchitis. We'll go ahead and discontinue lisinopril and switch to losartan. Consider other causes such as pertussis or TB. She still coughing when I  see him back then we'll evaluate him for pertussis.  Weakness - will do some additional lab work today to rule out anemia, thyroid disorder, electrolytes disturbance. We'll also get an echocardiogram to make sure that there is no systolic dysfunction or diastolic dysfunction of the heart which may be causing some episodes of fatigue and weakness.  Bradycardia - EKG today shows  rate of 70 bpm, normal sinus rhythm with normal axis and no acute ST-T wave changes. We'll go ahead and schedule for echocardiogram as well.  IFG - 11 A1c down to 5.6 which is improved from previous of 5.8.

## 2016-04-23 NOTE — Patient Instructions (Signed)
Discontinue lisinopril. He will take the losartan in its place.

## 2016-04-24 LAB — BRAIN NATRIURETIC PEPTIDE

## 2016-05-01 IMAGING — CR DG LUMBAR SPINE 2-3V
3 series · 3 of 3 positions shown · non-contrast
Comparison: None.

CLINICAL DATA: Low back pain with sciatica

EXAM:
LUMBAR SPINE - 2-3 VIEW

[l-spine ap]
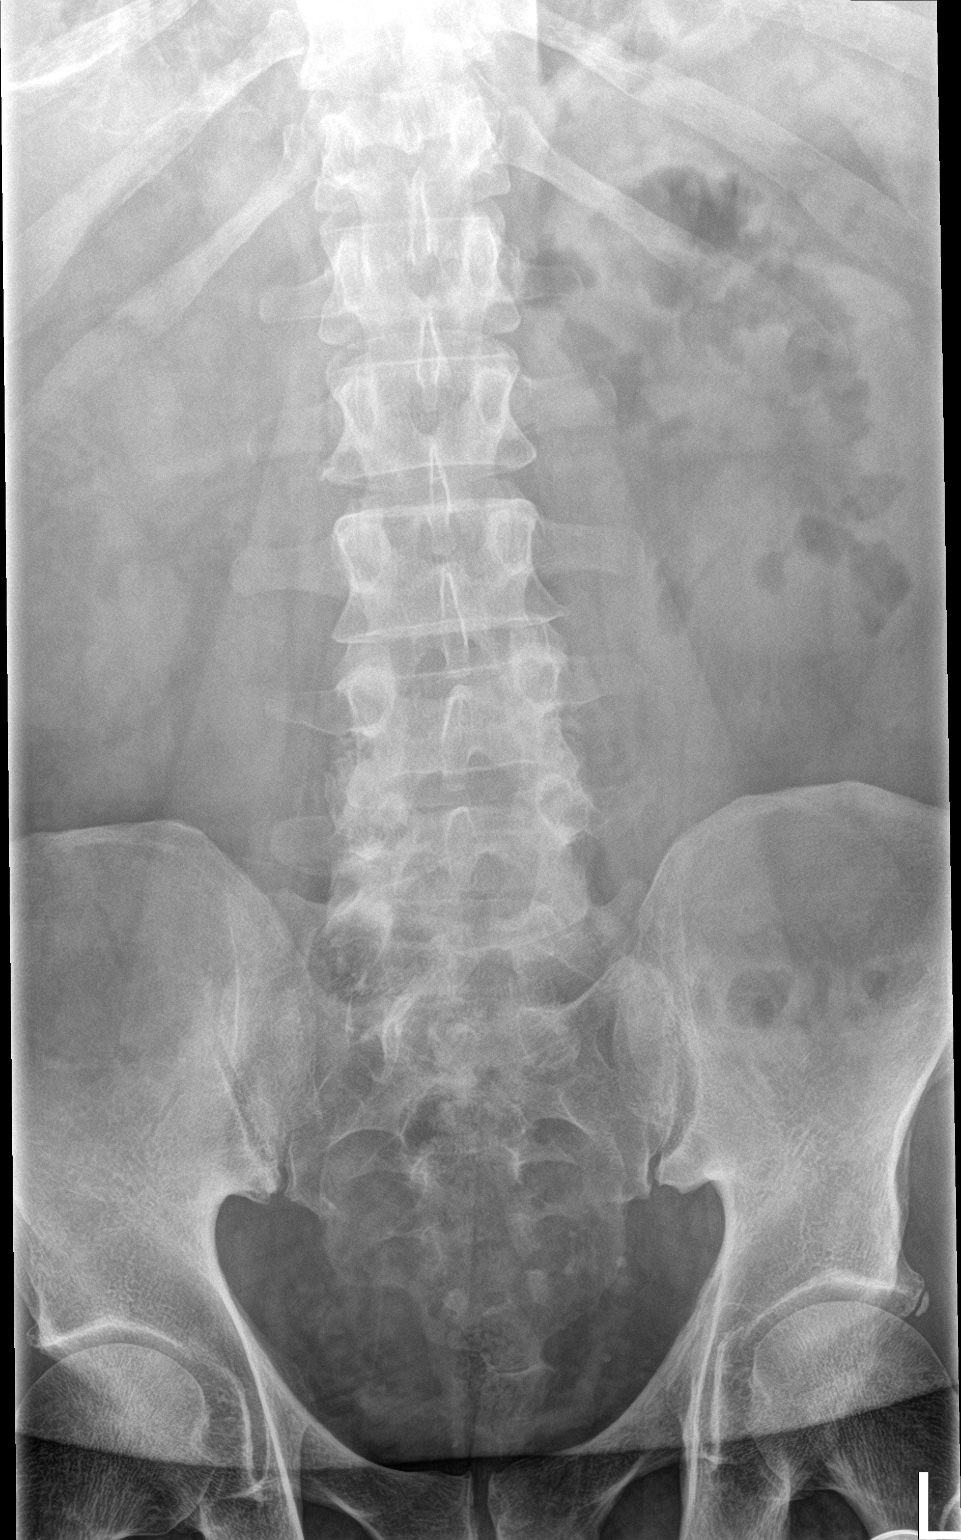

[l-spine lat]
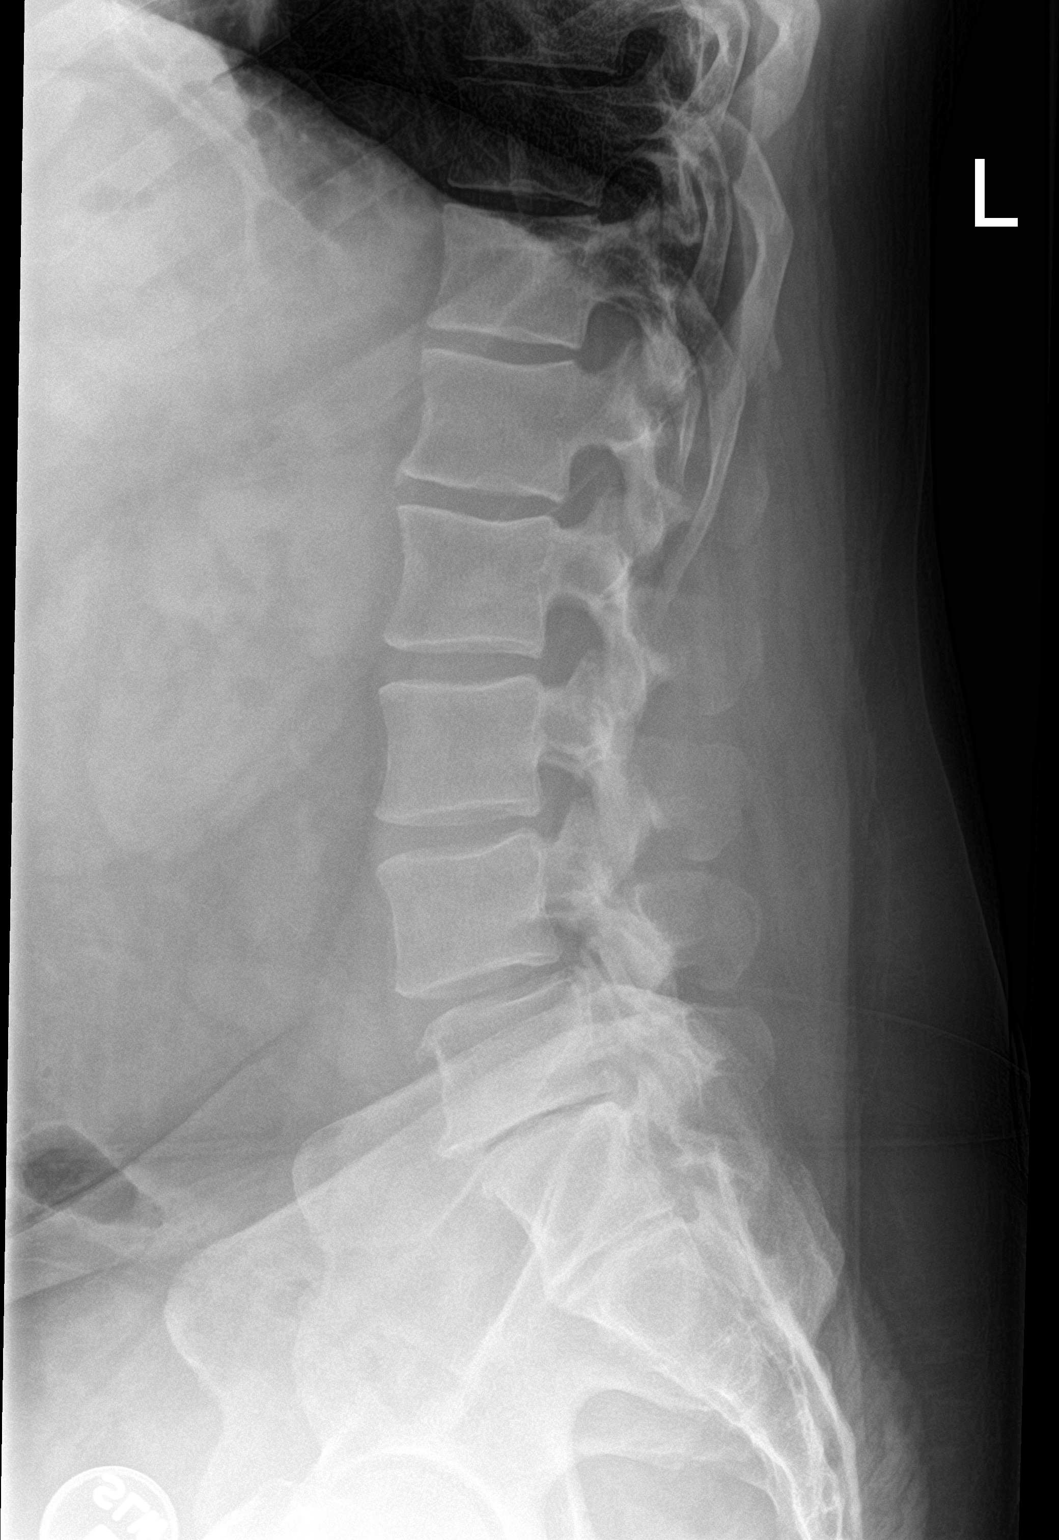

[l-spine spot]
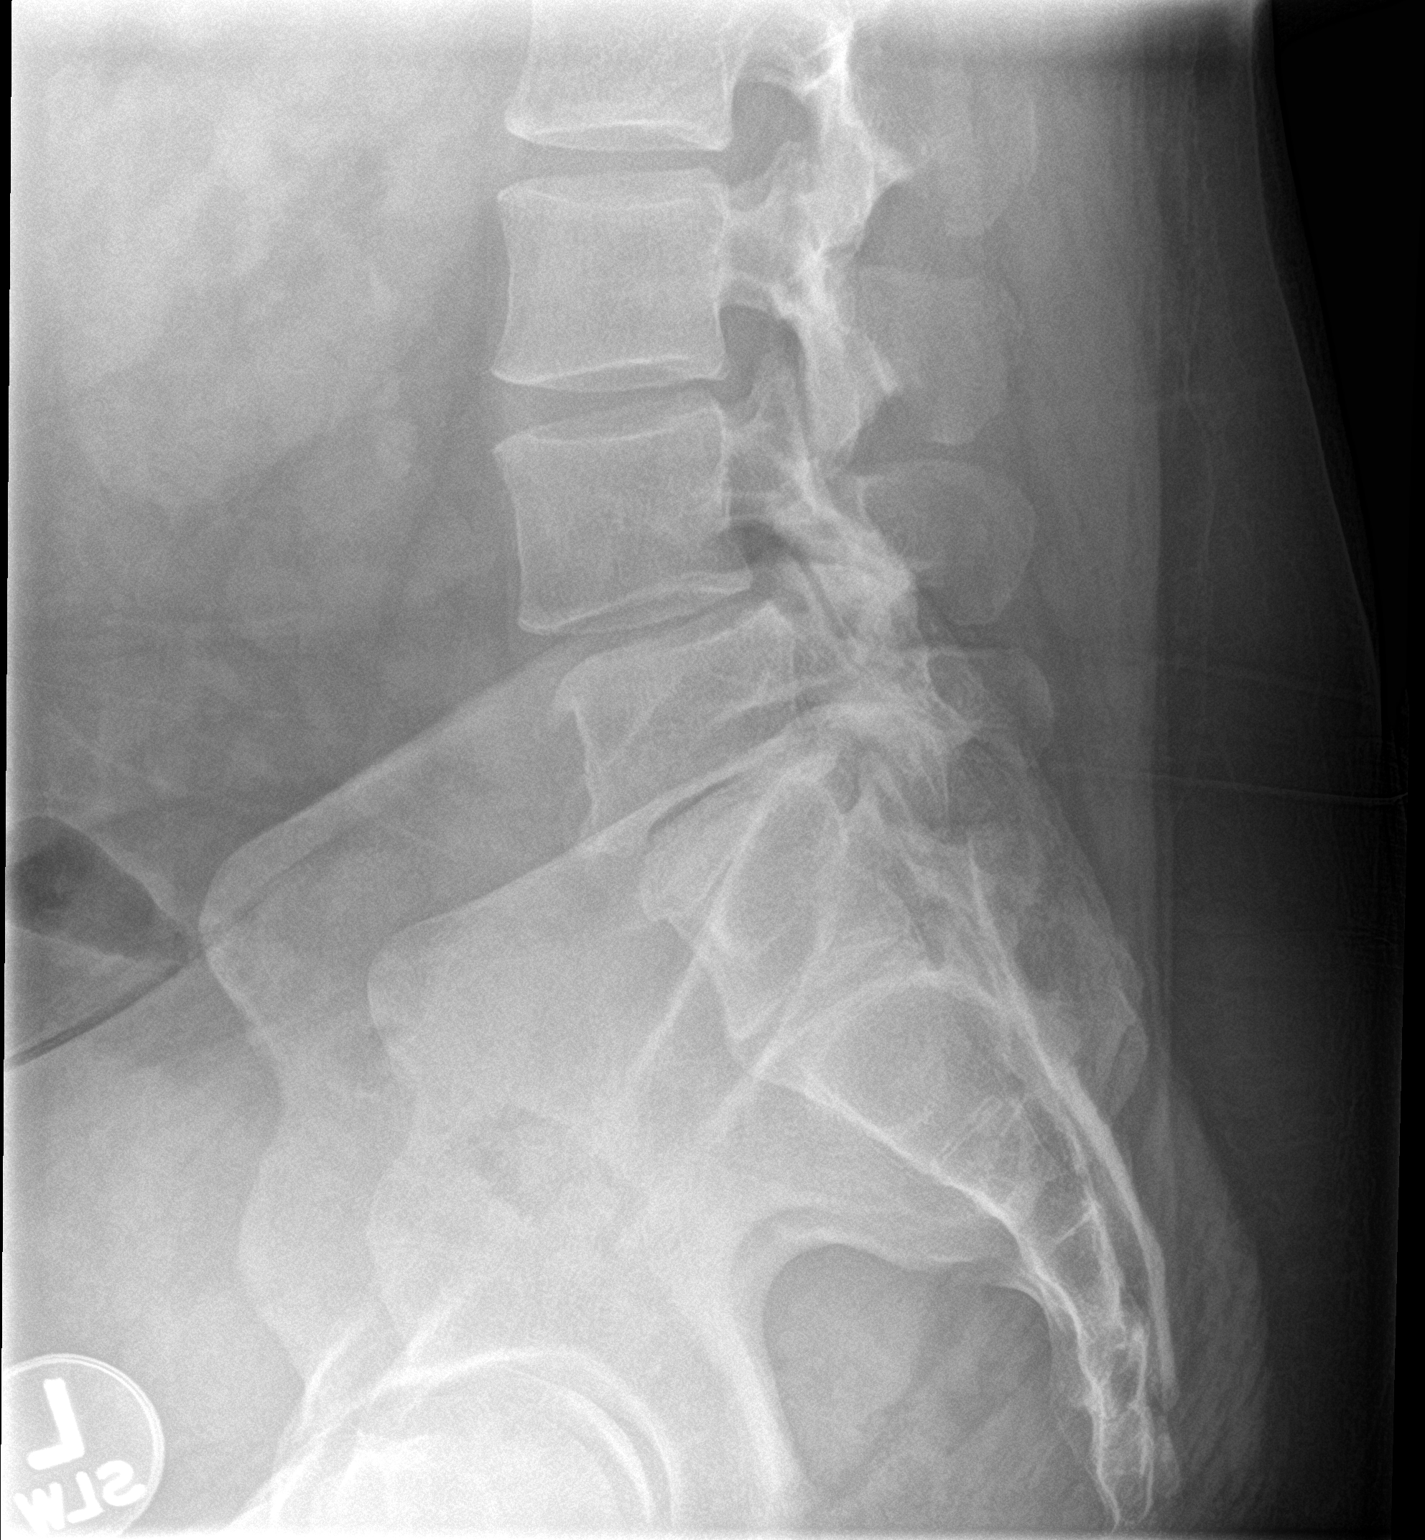

[3 of 3 positions shown; findings below may reference images not displayed]

FINDINGS: Normal alignment of lumbar vertebral bodies. There is disc space
narrowing at L5-S1 with endplate spurring. No pars fracture evident.
No subluxation.
IMPRESSION: Disc osteophytic disease at L5-S1.

## 2016-05-02 ENCOUNTER — Ambulatory Visit (HOSPITAL_BASED_OUTPATIENT_CLINIC_OR_DEPARTMENT_OTHER)
Admission: RE | Admit: 2016-05-02 | Discharge: 2016-05-02 | Disposition: A | Discharge status: Home / Self Care | Payer: 59 | Source: Ambulatory Visit | Attending: Family Medicine | Admitting: Family Medicine

## 2016-05-02 DIAGNOSIS — R05 Cough: Secondary | ICD-10-CM | POA: Diagnosis not present

## 2016-05-02 DIAGNOSIS — I440 Atrioventricular block, first degree: Secondary | ICD-10-CM | POA: Insufficient documentation

## 2016-05-02 DIAGNOSIS — R001 Bradycardia, unspecified: Secondary | ICD-10-CM | POA: Insufficient documentation

## 2016-05-02 DIAGNOSIS — J449 Chronic obstructive pulmonary disease, unspecified: Secondary | ICD-10-CM | POA: Diagnosis not present

## 2016-05-02 DIAGNOSIS — R0602 Shortness of breath: Secondary | ICD-10-CM | POA: Diagnosis not present

## 2016-05-02 DIAGNOSIS — E785 Hyperlipidemia, unspecified: Secondary | ICD-10-CM | POA: Insufficient documentation

## 2016-05-02 DIAGNOSIS — I119 Hypertensive heart disease without heart failure: Secondary | ICD-10-CM | POA: Diagnosis not present

## 2016-05-02 DIAGNOSIS — I38 Endocarditis, valve unspecified: Secondary | ICD-10-CM | POA: Insufficient documentation

## 2016-05-02 DIAGNOSIS — R059 Cough, unspecified: Secondary | ICD-10-CM

## 2016-05-02 NOTE — Progress Notes (Signed)
  Echocardiogram 2D Echocardiogram has been performed.  Cathie BeamsGREGORY, Jeanpaul Biehl 05/02/2016, 9:46 AM

## 2016-05-08 ENCOUNTER — Ambulatory Visit (INDEPENDENT_AMBULATORY_CARE_PROVIDER_SITE_OTHER): Payer: 59 | Admitting: Family Medicine

## 2016-05-08 ENCOUNTER — Encounter: Payer: Self-pay | Admitting: Family Medicine

## 2016-05-08 VITALS — BP 139/81 | HR 72 | Temp 97.8°F | Resp 14 | Ht 66.0 in | Wt 229.0 lb

## 2016-05-08 DIAGNOSIS — R053 Chronic cough: Secondary | ICD-10-CM

## 2016-05-08 DIAGNOSIS — R0602 Shortness of breath: Secondary | ICD-10-CM

## 2016-05-08 DIAGNOSIS — E669 Obesity, unspecified: Secondary | ICD-10-CM | POA: Insufficient documentation

## 2016-05-08 DIAGNOSIS — R05 Cough: Secondary | ICD-10-CM

## 2016-05-08 DIAGNOSIS — J449 Chronic obstructive pulmonary disease, unspecified: Secondary | ICD-10-CM

## 2016-05-08 DIAGNOSIS — Z6841 Body Mass Index (BMI) 40.0 and over, adult: Secondary | ICD-10-CM | POA: Insufficient documentation

## 2016-05-08 NOTE — Patient Instructions (Addendum)

## 2016-05-08 NOTE — Progress Notes (Signed)
Subjective:    CC: f/U dizziness/weakness  HPI:  pt reports that he still is experiencing some weakness,lightheadness. he stated that when he had his echo done he told the tech that where she was pressing with the probe he felt some pain/discomfort in that area. he told me today that the discomfort has moved to the middle of his chest and questions if this is a result of the bradycardia vs COPD.  Cough-we also recently switched off of an ACE inhibitor to an ARB about 2 weeks ago. He feels the cough is some better but not resolved.  He has been on the new medication for about 10 days.  COPD - has been using the spiriva some but not everyday.    She still struggling trying to lose weight. Hasn't been able to exercise because he hasn't felt well and says he's really been trying to cut back on foods that is just really struggling losing weight.  Past medical history, Surgical history, Family history not pertinant except as noted below, Social history, Allergies, and medications have been entered into the medical record, reviewed, and corrections made.   Review of Systems: No fevers, chills, night sweats, weight loss, chest pain, or shortness of breath.   Objective:    General: Well Developed, well nourished, and in no acute distress.  Neuro: Alert and oriented x3, extra-ocular muscles intact, sensation grossly intact.  HEENT: Normocephalic, atraumatic  Skin: Warm and dry, no rashes. Cardiac: Regular rate and rhythm, no murmurs rubs or gallops, no lower extremity edema.  Respiratory: Clear to auscultation bilaterally. Not using accessory muscles, speaking in full sentences. Chest: some tendenerss over the left sternal border with pressure.     Impression and Recommendations:   COPD - Schedule spirometry. Suspect that his COPD has progressed. His last test was in 2015 so we need to get this updated anyway.  SOB- reviewed echo results with him. No sign of heart failure. Could still consider  scheduling for treadmill stress test.  Obesity/BMI 36  -  discussed using [employer] to set calorie goals.

## 2016-05-21 ENCOUNTER — Ambulatory Visit: Payer: 59 | Admitting: Family Medicine

## 2016-05-21 ENCOUNTER — Ambulatory Visit (INDEPENDENT_AMBULATORY_CARE_PROVIDER_SITE_OTHER): Payer: 59 | Admitting: Family Medicine

## 2016-05-21 VITALS — BP 166/92 | HR 65 | Wt 229.0 lb

## 2016-05-21 DIAGNOSIS — J449 Chronic obstructive pulmonary disease, unspecified: Secondary | ICD-10-CM

## 2016-05-21 DIAGNOSIS — R059 Cough, unspecified: Secondary | ICD-10-CM

## 2016-05-21 DIAGNOSIS — R05 Cough: Secondary | ICD-10-CM | POA: Diagnosis not present

## 2016-05-21 MED ORDER — ALBUTEROL SULFATE (2.5 MG/3ML) 0.083% IN NEBU
2.5000 mg | INHALATION_SOLUTION | Freq: Once | RESPIRATORY_TRACT | Status: AC
  Administered 2016-05-21: 2.5 mg via RESPIRATORY_TRACT

## 2016-05-21 NOTE — Progress Notes (Signed)
Subjective:    CC: Spirometry  HPI: COPD- patient came in today for spirometry. He has been complaining of weakness lightheadedness and cough and increased shortness of breath. We recently switched him off the lisinopril to losartan and he does feel the cough is gotten better but still is having episodes of weakness and lightheadedness. He has never been a smoker.  Past medical history, Surgical history, Family history not pertinant except as noted below, Social history, Allergies, and medications have been entered into the medical record, reviewed, and corrections made.   Review of Systems: No fevers, chills, night sweats, weight loss, chest pain, or shortness of breath.   Objective:    General: Well Developed, well nourished, and in no acute distress.  Neuro: Alert and oriented x3, extra-ocular muscles intact, sensation grossly intact.  HEENT: Normocephalic, atraumatic  Skin: Warm and dry, no rashes.   Impression and Recommendations:   COPD - FVC 2 years ago was 95% and today was 69% with good effort. FEV1 2 years ago was 77% and is down to 56% predicted. Ratio was previously 65 and today is 62. This is a significant change in obstruction with also a decrease in FVC showing some possible restriction as well. This is a rapid change in a 2 year time. Recommend referral to pulmonology for further evaluation. He will likely need full PFTs and discuss this with him today. He wants to hold off on scheduling his stress test until he is able to see pulmonology.

## 2016-06-18 ENCOUNTER — Other Ambulatory Visit: Payer: 59

## 2016-06-18 ENCOUNTER — Encounter: Payer: Self-pay | Admitting: Emergency Medicine

## 2016-06-18 ENCOUNTER — Ambulatory Visit (INDEPENDENT_AMBULATORY_CARE_PROVIDER_SITE_OTHER): Payer: 59 | Admitting: Emergency Medicine

## 2016-06-18 VITALS — BP 128/90 | HR 66 | Ht 66.5 in | Wt 234.0 lb

## 2016-06-18 DIAGNOSIS — Z23 Encounter for immunization: Secondary | ICD-10-CM

## 2016-06-18 DIAGNOSIS — R059 Cough, unspecified: Secondary | ICD-10-CM | POA: Insufficient documentation

## 2016-06-18 DIAGNOSIS — R0600 Dyspnea, unspecified: Secondary | ICD-10-CM | POA: Diagnosis not present

## 2016-06-18 DIAGNOSIS — J449 Chronic obstructive pulmonary disease, unspecified: Secondary | ICD-10-CM

## 2016-06-18 DIAGNOSIS — R05 Cough: Secondary | ICD-10-CM | POA: Insufficient documentation

## 2016-06-18 NOTE — Assessment & Plan Note (Signed)
Cough has improved since his lisinopril was stopped and changed to an alternative.

## 2016-06-18 NOTE — Patient Instructions (Addendum)
We will perform blood work today We will arrange for full pulmonary function testing Stop Spiriva for now Keep albuterol available to use 2 puffs as needed for shortness of breath Follow with Dr Delton CoombesByrum next available to review your breathing tests

## 2016-06-18 NOTE — Addendum Note (Signed)
Addended by: Maisie FusGREEN, ASHTYN M on: 06/18/2016 05:16 PM   Modules accepted: Orders

## 2016-06-18 NOTE — Progress Notes (Signed)
Subjective:    Patient ID: Lance Hernandez, male    DOB: 05/10/1956, 60 y.o.   MRN: [id number]  HPI 60 year old never smoker with a history of Scoliosis, hypertension, anxiety, DJD, colitis. He is referred today by Dr Linford ArnoldMetheney for evaluation of obstructive lung disease, cough. A review of office notes indicates that he had been on lisinopril but this was changed In July with some improvement in his cough. Spirometry from 07/08/14 showed an FEV1 of 77% predicted with a ratio of 80% suggestive of mild obstruction. On spirometry done 05/21/16 the notes indicate that there was significant decrease in both his FVC and his FEV1 compared with 2015. He states that he has had some light-headedness, dizziness. He has some mild exertional SOB, especially notices it with mowing the lawn. Cannot walk treadmill as far as in the past. Cough is now much improved. He has occasional wheeze. He was trialed on Spiriva beginning couple years ago, but he has only been taking prn.   He has a family hx of COPD, second hand smoke.  CXR from 04/08/16 > some evidence for mild hyperinflation  No known sensitivities to smoke or perfume, etc.    Review of Systems  Constitutional: Negative for fever and unexpected weight change.  HENT: Negative for congestion, dental problem, ear pain, nosebleeds, postnasal drip, rhinorrhea, sinus pressure, sneezing, sore throat and trouble swallowing.   Eyes: Negative for redness and itching.  Respiratory: Positive for shortness of breath. Negative for cough, chest tightness and wheezing.   Cardiovascular: Negative for palpitations and leg swelling.  Gastrointestinal: Negative for nausea and vomiting.  Genitourinary: Negative for dysuria.  Musculoskeletal: Negative for joint swelling.  Skin: Positive for rash.  Neurological: Negative for headaches.  Hematological: Does not bruise/bleed easily.  Psychiatric/Behavioral: Negative for dysphoric mood. The patient is nervous/anxious.     Past  Medical History:  Diagnosis Date  . Colitis   . DJD (degenerative joint disease)   . First degree AV block   . History of prostatitis   . Hyperlipidemia   . Hypertension   . Scoliosis      Family History  Problem Relation Age of Onset  . COPD Mother     Smoker  . COPD Sister     Smoker     Social History   Social History  . Marital status: Married    Spouse name: susan   . Number of children: 1  . Years of education: N/A   Occupational History  . Sales     Computer work   Social History Main Topics  . Smoking status: Never Smoker  . Smokeless tobacco: Never Used  . Alcohol use Yes     Comment: occasional - wine on holidays  . Drug use: No  . Sexual activity: Yes    Partners: Female   Other Topics Concern  . Not on file   Social History Narrative   Next caffeine. No regular exercise. He does plan on joining a gym. His wife is diabetic.  He has worked spraying lacquer on furniture.  No military   Allergies  Allergen Reactions  . Phentermine Other (See Comments)    Nausea  . Sulfa Antibiotics Rash  . Tramadol Rash     Outpatient Medications Prior to Visit  Medication Sig Dispense Refill  . Albuterol Sulfate (PROAIR RESPICLICK) 108 (90 Base) MCG/ACT AEPB Inhale 2 puffs into the lungs every 6 (six) hours. 1 each 1  . losartan (COZAAR) 50 MG tablet Take 1 tablet (  50 mg total) by mouth daily. 90 tablet 3  . pravastatin (PRAVACHOL) 40 MG tablet TAKE 1 TABLET DAILY (ADDITIONAL REFILLS REQUIRE FOLLOW UP APPOINTMENT FOR LABS) 90 tablet 0  . Tiotropium Bromide Monohydrate (SPIRIVA RESPIMAT) 1.25 MCG/ACT AERS Inhale 2 puffs into the lungs daily. 4 g 1   No facility-administered medications prior to visit.         Objective:   Physical Exam Vitals:   06/18/16 1442  BP: 128/90  Pulse: 66  SpO2: 95%  Weight: 234 lb (106.1 kg)  Height: 5' 6.5" (1.689 m)   Gen: Pleasant, obese man, in no distress,  normal affect  ENT: No lesions,  mouth clear,  oropharynx  clear, no postnasal drip, poor dentition  Neck: No JVD, no TMG, no carotid bruits  Lungs: No use of accessory muscles, clear without rales or rhonchi  Cardiovascular: RRR, heart sounds normal, no murmur or gallops, no peripheral edema  Musculoskeletal: No deformities, no cyanosis or clubbing  Neuro: alert, non focal  Skin: Warm, no lesions or rashes        Assessment & Plan:  COPD, 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 We will perform full pulmonary function testing to confirm this diagnosis. He is only using Spiriva when necessary and we will stop this for now. He will keep albuterol available. We will perform an alpha-1 antitrypsin screening evaluation. Depending on his PFT will decide whether to continue bronchodilators.  Cough Cough has improved since his lisinopril was stopped and changed to an alternative.  Levy Pupa, MD, PhD 06/18/2016, 3:13 PM [facility] [phone] or if no answer [phone]

## 2016-06-18 NOTE — Assessment & Plan Note (Signed)
Suspected based on his recent spirometry and also spirometry from 2015. We will perform full pulmonary function testing to confirm this diagnosis. He is only using Spiriva when necessary and we will stop this for now. He will keep albuterol available. We will perform an alpha-1 antitrypsin screening evaluation. Depending on his PFT will decide whether to continue bronchodilators.

## 2016-06-21 LAB — ALPHA-1 ANTITRYPSIN PHENOTYPE: A1 ANTITRYPSIN: 107 mg/dL (ref 83–199)

## 2016-06-25 ENCOUNTER — Ambulatory Visit (HOSPITAL_COMMUNITY)
Admission: RE | Admit: 2016-06-25 | Discharge: 2016-06-25 | Disposition: A | Discharge status: Home / Self Care | Payer: 59 | Source: Ambulatory Visit | Attending: Emergency Medicine | Admitting: Emergency Medicine

## 2016-06-25 DIAGNOSIS — R0600 Dyspnea, unspecified: Secondary | ICD-10-CM | POA: Diagnosis present

## 2016-06-25 LAB — PULMONARY FUNCTION TEST
DL/VA % pred: 106 %
DL/VA: 4.64 ml/min/mmHg/L
DLCO UNC % PRED: 86 %
DLCO UNC: 23.33 ml/min/mmHg
FEF 25-75 PRE: 1.02 L/s
FEF 25-75 Post: 1.16 L/sec
FEF2575-%CHANGE-POST: 13 %
FEF2575-%Pred-Post: 43 %
FEF2575-%Pred-Pre: 38 %
FEV1-%Change-Post: 5 %
FEV1-%PRED-POST: 64 %
FEV1-%PRED-PRE: 60 %
FEV1-POST: 2.03 L
FEV1-Pre: 1.92 L
FEV1FVC-%Change-Post: 3 %
FEV1FVC-%Pred-Pre: 84 %
FEV6-%CHANGE-POST: 5 %
FEV6-%PRED-POST: 76 %
FEV6-%PRED-PRE: 72 %
FEV6-PRE: 2.88 L
FEV6-Post: 3.03 L
FEV6FVC-%CHANGE-POST: 1 %
FEV6FVC-%PRED-PRE: 103 %
FEV6FVC-%Pred-Post: 104 %
FVC-%CHANGE-POST: 2 %
FVC-%Pred-Post: 73 %
FVC-%Pred-Pre: 71 %
FVC-Post: 3.06 L
FVC-Pre: 2.98 L
Post FEV1/FVC ratio: 66 %
Post FEV6/FVC ratio: 99 %
Pre FEV1/FVC ratio: 64 %
Pre FEV6/FVC Ratio: 98 %
RV % PRED: 88 %
RV: 1.77 L
TLC % pred: 79 %
TLC: 4.93 L

## 2016-06-25 MED ORDER — ALBUTEROL SULFATE (2.5 MG/3ML) 0.083% IN NEBU
2.5000 mg | INHALATION_SOLUTION | Freq: Once | RESPIRATORY_TRACT | Status: AC
  Administered 2016-06-25: 2.5 mg via RESPIRATORY_TRACT

## 2016-07-09 ENCOUNTER — Ambulatory Visit (INDEPENDENT_AMBULATORY_CARE_PROVIDER_SITE_OTHER): Payer: 59 | Admitting: Emergency Medicine

## 2016-07-09 ENCOUNTER — Encounter: Payer: Self-pay | Admitting: Emergency Medicine

## 2016-07-09 DIAGNOSIS — J449 Chronic obstructive pulmonary disease, unspecified: Secondary | ICD-10-CM

## 2016-07-09 MED ORDER — BUDESONIDE-FORMOTEROL FUMARATE 160-4.5 MCG/ACT IN AERO: 2.0000 | INHALATION_SPRAY | Freq: Two times a day (BID) | RESPIRATORY_TRACT | 5 refills | Status: DC

## 2016-07-09 NOTE — Patient Instructions (Signed)
Please start Symbicort 2 puffs twice a day and use this for least one month. Depending on how your breathing changes we may decide to continue this medication. Rinse and gargle after using Follow with Dr Delton CoombesByrum in 2 months or sooner if you have any problems.

## 2016-07-09 NOTE — Progress Notes (Signed)
Subjective:    Patient ID: Lance Hernandez, male    DOB: 06/27/1956, 60 y.o.   MRN: [id number]  HPI 60 year old never smoker with a history of Scoliosis, hypertension, anxiety, DJD, colitis. He is referred today by Dr Linford ArnoldMetheney for evaluation of obstructive lung disease, cough. A review of office notes indicates that he had been on lisinopril but this was changed In July with some improvement in his cough. Spirometry from 07/08/14 showed an FEV1 of 77% predicted with a ratio of 80% suggestive of mild obstruction. On spirometry done 05/21/16 the notes indicate that there was significant decrease in both his FVC and his FEV1 compared with 2015. He states that he has had some light-headedness, dizziness. He has some mild exertional SOB, especially notices it with mowing the lawn. Cannot walk treadmill as far as in the past. Cough is now much improved. He has occasional wheeze. He was trialed on Spiriva beginning couple years ago, but he has only been taking prn.   ROV 07/09/16 -- Patient follows up today for his suspected obstructive lung disease. Initially based on spirometry from 2015. He underwent repeat pulmonary function testing on 06/25/16 that I personally reviewed. This shows moderately severe obstruction without bronchodilator response, probable superimposed restrictive changes. An alpha-1 antitrypsin was performed, genotype MM. We stopped spiriva last time.   He has a family hx of COPD, second hand smoke.  CXR from 04/08/16 > some evidence for mild hyperinflation  No known sensitivities to smoke or perfume, etc.    Review of Systems  Constitutional: Negative for fever and unexpected weight change.  HENT: Negative for congestion, dental problem, ear pain, nosebleeds, postnasal drip, rhinorrhea, sinus pressure, sneezing, sore throat and trouble swallowing.   Eyes: Negative for redness and itching.  Respiratory: Positive for shortness of breath. Negative for cough, chest tightness and wheezing.     Cardiovascular: Negative for palpitations and leg swelling.  Gastrointestinal: Negative for nausea and vomiting.  Genitourinary: Negative for dysuria.  Musculoskeletal: Negative for joint swelling.  Skin: Positive for rash.  Neurological: Negative for headaches.  Hematological: Does not bruise/bleed easily.  Psychiatric/Behavioral: Negative for dysphoric mood. The patient is nervous/anxious.     Past Medical History:  Diagnosis Date  . Colitis   . DJD (degenerative joint disease)   . First degree AV block   . History of prostatitis   . Hyperlipidemia   . Hypertension   . Scoliosis      Family History  Problem Relation Age of Onset  . COPD Mother     Smoker  . COPD Sister     Smoker     Social History   Social History  . Marital status: Married    Spouse name: susan   . Number of children: 1  . Years of education: N/A   Occupational History  . Sales     Computer work   Social History Main Topics  . Smoking status: Never Smoker  . Smokeless tobacco: Never Used  . Alcohol use Yes     Comment: occasional - wine on holidays  . Drug use: No  . Sexual activity: Yes    Partners: Female   Other Topics Concern  . Not on file   Social History Narrative   Next caffeine. No regular exercise. He does plan on joining a gym. His wife is diabetic.  He has worked spraying lacquer on furniture.  No military   Allergies  Allergen Reactions  . Phentermine Other (See Comments)  Nausea  . Sulfa Antibiotics Rash  . Tramadol Rash     Outpatient Medications Prior to Visit  Medication Sig Dispense Refill  . Albuterol Sulfate (PROAIR RESPICLICK) 108 (90 Base) MCG/ACT AEPB Inhale 2 puffs into the lungs every 6 (six) hours. 1 each 1  . losartan (COZAAR) 50 MG tablet Take 1 tablet (50 mg total) by mouth daily. 90 tablet 3  . pravastatin (PRAVACHOL) 40 MG tablet TAKE 1 TABLET DAILY (ADDITIONAL REFILLS REQUIRE FOLLOW UP APPOINTMENT FOR LABS) 90 tablet 0  . Tiotropium Bromide  Monohydrate (SPIRIVA RESPIMAT) 1.25 MCG/ACT AERS Inhale 2 puffs into the lungs daily. (Patient not taking: Reported on 07/09/2016) 4 g 1   No facility-administered medications prior to visit.         Objective:   Physical Exam Vitals:   07/09/16 1429  BP: 132/90  Pulse: 63  SpO2: 94%  Weight: 233 lb 9.6 oz (106 kg)  Height: 5' 6.5" (1.689 m)   Gen: Pleasant, obese man, in no distress,  normal affect  ENT: No lesions,  mouth clear,  oropharynx clear, no postnasal drip, poor dentition  Neck: No JVD, no TMG, no carotid bruits  Lungs: No use of accessory muscles, clear without rales or rhonchi  Cardiovascular: RRR, heart sounds normal, no murmur or gallops, no peripheral edema  Musculoskeletal: No deformities, no cyanosis or clubbing  Neuro: alert, non focal  Skin: Warm, no lesions or rashes        Assessment & Plan:  COPD, mild COPD in absence of history of tobacco use. Suspect due to chronic asthma. He does have exertional dyspnea. I like to give a trial of Symbicort for at least 1 month to see if he benefits.  Levy Pupa, MD, PhD 07/09/2016, 2:55 PM [facility] [phone] or if no answer [phone]

## 2016-07-09 NOTE — Assessment & Plan Note (Signed)
COPD in absence of history of tobacco use. Suspect due to chronic asthma. He does have exertional dyspnea. I like to give a trial of Symbicort for at least 1 month to see if he benefits.

## 2016-07-11 ENCOUNTER — Telehealth: Payer: Self-pay | Admitting: Emergency Medicine

## 2016-07-11 NOTE — Telephone Encounter (Signed)
These things are possibly related. Have him stop the Symbicort to insure that the symptoms resolve. We can retry the symbicort or change to an alternative inhaler in the future/.

## 2016-07-11 NOTE — Telephone Encounter (Signed)
Er 07/09/16 OV: Instructions  Please start Symbicort 2 puffs twice a day and use this for least one month. Depending on how your breathing changes we may decide to continue this medication. Rinse and gargle after using Follow with Dr Delton CoombesByrum in 2 months or sooner if you have any problems.  --  Spoke with pt. He started the symbicort yesterday. C/o dry throat, irritation, hoarseness, he has an urge to urinate but can't. Wants to know if the symbicort is causing this? Any recs? Please Dr. Delton CoombesByrum thanks

## 2016-07-11 NOTE — Telephone Encounter (Signed)
Called made pt aware of recs. He verbalized understanding. Nothing further needed

## 2016-07-25 ENCOUNTER — Telehealth: Payer: Self-pay | Admitting: Family Medicine

## 2016-07-25 NOTE — Telephone Encounter (Signed)
We could increase his losartan to 2 tabs which we are total of 100 mg. Follow blood pressure through the weekend and then give us a call on Monday. When we change the blood pressure medication dose it can take several days for it to really kick back in. Nani Gasseratherine Tarry Fountain, MD

## 2016-07-25 NOTE — Telephone Encounter (Signed)
Pt called clinic this am stating his BP has been running high. His BP this am was 150/113. Pt reports some dizziness, no other symptoms. Pt states for the past 2 weeks he cut his losartan Rx and pravastatin Rx in half. Pt reports "it was stupid for me to do" and for the past 3 days has been taking his prescribed dose. Pt reports if there are any other adjustments that need to be made. Will route to PCP for review.

## 2016-07-25 NOTE — Telephone Encounter (Signed)
Pt advised of recommendation, verbalized understanding.  

## 2016-08-08 ENCOUNTER — Telehealth: Payer: Self-pay | Admitting: *Deleted

## 2016-08-08 ENCOUNTER — Other Ambulatory Visit: Payer: Self-pay | Admitting: Family Medicine

## 2016-08-08 MED ORDER — LOSARTAN POTASSIUM 50 MG PO TABS: 50.0000 mg | ORAL_TABLET | Freq: Every day | ORAL | 3 refills | Status: DC

## 2016-08-08 NOTE — Telephone Encounter (Signed)
Lance Hernandez from [facility] called stating that pt would like to transfer his losartan to their pharmacy and that his mail order told them that it would require a new script. Will send over a new script for patient for 3 month supply.Lance PacasBarkley, Lance [facility]

## 2016-09-11 ENCOUNTER — Ambulatory Visit: Payer: 59 | Admitting: Emergency Medicine

## 2017-01-02 ENCOUNTER — Encounter: Payer: Self-pay | Admitting: Family Medicine

## 2017-01-02 ENCOUNTER — Ambulatory Visit (INDEPENDENT_AMBULATORY_CARE_PROVIDER_SITE_OTHER): Payer: BLUE CROSS/BLUE SHIELD | Admitting: Family Medicine

## 2017-01-02 VITALS — BP 134/81 | HR 100 | Ht 67.0 in | Wt 228.0 lb

## 2017-01-02 DIAGNOSIS — M545 Low back pain: Secondary | ICD-10-CM | POA: Diagnosis not present

## 2017-01-02 DIAGNOSIS — R110 Nausea: Secondary | ICD-10-CM | POA: Diagnosis not present

## 2017-01-02 LAB — POCT URINALYSIS DIPSTICK
BILIRUBIN UA: NEGATIVE
Blood, UA: NEGATIVE
Glucose, UA: NEGATIVE
Ketones, UA: NEGATIVE
LEUKOCYTES UA: NEGATIVE
Nitrite, UA: NEGATIVE
PH UA: 5.5 (ref 5.0–8.0)
PROTEIN UA: NEGATIVE
Spec Grav, UA: 1.01 (ref 1.030–1.035)
UROBILINOGEN UA: 0.2 (ref ?–2.0)

## 2017-01-02 MED ORDER — TAMSULOSIN HCL 0.4 MG PO CAPS: 0.4000 mg | ORAL_CAPSULE | Freq: Every day | ORAL | 1 refills | Status: DC

## 2017-01-02 MED ORDER — HYDROCODONE-ACETAMINOPHEN 5-325 MG PO TABS: 1.0000 | ORAL_TABLET | Freq: Four times a day (QID) | ORAL | 0 refills | Status: DC | PRN

## 2017-01-02 NOTE — Progress Notes (Signed)
Subjective:    Patient ID: Lance Hernandez, male    DOB: 03/29/1956, 61 y.o.   MRN: [id number]  HPI pt states that he began having pain in his back 1 wk ago and a few days ago he said that the pain went into his L low back he said that at times it radiates. he reports that 17 yrs ago he had kidney stones and this was the same pain that he had then. he denies pain w/urination, fever. he did report that this morning he had a cold sweat and some nausea.   Been using Aleve and Tylenol but really hasn't been taking his pain. He says the pain is constant significantly worse over the last 3 days at times rates his pain an 8 out of 10. He says it comes in waves. Right now is about a 4 out of 10. He has had some mild intermittent diarrhea with it and some cold sweats. He has not seen any blood in the urine. He says sometimes position change will aggravate his pain.   Review of Systems  BP 134/81   Pulse 100   Ht 5\' 7"  (1.702 m)   Wt 228 lb (103.4 kg)   SpO2 99%   BMI 35.71 kg/m     Allergies  Allergen Reactions  . Phentermine Other (See Comments)    Nausea  . Sulfa Antibiotics Rash  . Tramadol Rash    Past Medical History:  Diagnosis Date  . Colitis   . DJD (degenerative joint disease)   . First degree AV block   . History of prostatitis   . Hyperlipidemia   . Hypertension   . Scoliosis     Past Surgical History:  Procedure Laterality Date  . TONSILLECTOMY      Social History   Social History  . Marital status: Married    Spouse name: susan   . Number of children: 1  . Years of education: N/A   Occupational History  . Sales     Computer work   Social History Main Topics  . Smoking status: Never Smoker  . Smokeless tobacco: Never Used  . Alcohol use Yes     Comment: occasional - wine on holidays  . Drug use: No  . Sexual activity: Yes    Partners: Female   Other Topics Concern  . Not on file   Social History Narrative   Next caffeine. No regular exercise. He does  plan on joining a gym. His wife is diabetic.    Family History  Problem Relation Age of Onset  . COPD Mother     Smoker  . COPD Sister     Smoker    Outpatient Encounter Prescriptions as of 01/02/2017  Medication Sig  . budesonide-formoterol (SYMBICORT) 160-4.5 MCG/ACT inhaler Inhale 2 puffs into the lungs 2 (two) times daily.  Marland Kitchen. losartan (COZAAR) 50 MG tablet Take 1 tablet (50 mg total) by mouth daily.  . pravastatin (PRAVACHOL) 40 MG tablet take 1 tablet by mouth once daily  . Tiotropium Bromide Monohydrate (SPIRIVA RESPIMAT) 1.25 MCG/ACT AERS Inhale 2 puffs into the lungs daily.  Marland Kitchen. HYDROcodone-acetaminophen (NORCO/VICODIN) 5-325 MG tablet Take 1 tablet by mouth every 6 (six) hours as needed for moderate pain.  . tamsulosin (FLOMAX) 0.4 MG CAPS capsule Take 1 capsule (0.4 mg total) by mouth daily.  . [DISCONTINUED] Albuterol Sulfate (PROAIR RESPICLICK) 108 (90 Base) MCG/ACT AEPB Inhale 2 puffs into the lungs every 6 (six) hours.   No facility-administered  encounter medications on file as of 01/02/2017.          Objective:   Physical Exam  Constitutional: He is oriented to person, place, and time. He appears well-developed and well-nourished.  HENT:  Head: Normocephalic and atraumatic.  Right Ear: External ear normal.  Left Ear: External ear normal.  Cardiovascular: Normal rate, regular rhythm and normal heart sounds.   Pulmonary/Chest: Effort normal and breath sounds normal.  Abdominal: Soft. Bowel sounds are normal. He exhibits no distension. There is no tenderness. There is no rebound.  Musculoskeletal:  Normal lumbar flexion, extension, rotation left and right. He does have very tight hamstrings. Negative straight leg raise bilaterally. Hip, knee, ankle strength is 5 out of 5 with 2+ patellar reflexes bilaterally. Nontender over the thoracic or lumbar spine. Nontender over the right or left low spine. Nontender SI joints.  Neurological: He is alert and oriented to person,  place, and time.  Skin: Skin is warm and dry.  Psychiatric: He has a normal mood and affect. His behavior is normal.        Assessment & Plan:  Left low back pain-unclear etiology at this point. He says this feels very much like when he had a kidney stone years ago. Urinalysis was negative for blood but that does not rule out a kidney stone. We will give him Flomax, some hydrocodone to take as needed since the Aleve and Tylenol are not helping and a urine strainer. Also consider that this could be muscular skeletal so did encourage him to continue his Aleve, given a handout with back stretches for his low spine. If not improving significantly over the next week then consider further evaluation with CT protocol for renal stone.

## 2017-02-04 ENCOUNTER — Other Ambulatory Visit: Payer: Self-pay | Admitting: Family Medicine

## 2017-02-04 ENCOUNTER — Telehealth: Payer: Self-pay | Admitting: Family Medicine

## 2017-02-04 ENCOUNTER — Ambulatory Visit (INDEPENDENT_AMBULATORY_CARE_PROVIDER_SITE_OTHER): Payer: BLUE CROSS/BLUE SHIELD

## 2017-02-04 DIAGNOSIS — K5730 Diverticulosis of large intestine without perforation or abscess without bleeding: Secondary | ICD-10-CM | POA: Diagnosis not present

## 2017-02-04 DIAGNOSIS — R109 Unspecified abdominal pain: Secondary | ICD-10-CM

## 2017-02-04 DIAGNOSIS — Z87442 Personal history of urinary calculi: Secondary | ICD-10-CM

## 2017-02-04 DIAGNOSIS — K8020 Calculus of gallbladder without cholecystitis without obstruction: Secondary | ICD-10-CM

## 2017-02-04 MED ORDER — TRAMADOL HCL 50 MG PO TABS: 50.0000 mg | ORAL_TABLET | Freq: Three times a day (TID) | ORAL | 0 refills | Status: DC | PRN

## 2017-02-04 NOTE — Telephone Encounter (Signed)
Ct scan authorized. Pt advised of plan, imaging advised to contact Pt for scheduling.

## 2017-02-04 NOTE — Telephone Encounter (Signed)
Per PCP, CT renal stone study ordered.

## 2017-02-04 NOTE — Telephone Encounter (Signed)
Pt called clinic today stating he feels he has a kidney stone. Upon review of last OV, he was started on tamsulosin (FLOMAX) 0.4 MG. Pt reports he took this for 2 weeks, but it caused him to have a bad cough so he stopped. Pt states he feels he never passed that stone, and now it is moving around. Questions if he should go for the CT scan or get a urology referral.  Pt would like something for pain, reports he still has some of the Hydrocodone but doesn't like taking it. Would like something not as strong.

## 2017-02-04 NOTE — Progress Notes (Signed)
tam

## 2017-02-05 ENCOUNTER — Telehealth: Payer: Self-pay

## 2017-02-05 MED ORDER — CYCLOBENZAPRINE HCL 10 MG PO TABS: 10.0000 mg | ORAL_TABLET | Freq: Every evening | ORAL | 0 refills | Status: DC | PRN

## 2017-02-05 MED ORDER — PREDNISONE 20 MG PO TABS: 40.0000 mg | ORAL_TABLET | Freq: Every day | ORAL | 0 refills | Status: DC

## 2017-02-05 MED ORDER — NAPROXEN 500 MG PO TABS: 500.0000 mg | ORAL_TABLET | Freq: Two times a day (BID) | ORAL | 0 refills | Status: DC

## 2017-02-05 NOTE — Telephone Encounter (Signed)
OK, please tell him I apologize. I was trying to think of something we could send through the computer.  Going to send over muscle relaxer for him to try at bedtime as well as 5 days of prednisone to help with inflammation. After he completes the prednisone he can try the Naprosyn twice a day as needed. we can mail him some exercises to do for his low back that I think would be helpful as well.

## 2017-02-05 NOTE — Telephone Encounter (Signed)
Aiken states he is allergic to Tramadol. He breaks out in a rash all over his body. He would like a different pain medication.

## 2017-02-06 ENCOUNTER — Encounter: Payer: Self-pay | Admitting: Family Medicine

## 2017-02-06 ENCOUNTER — Ambulatory Visit (INDEPENDENT_AMBULATORY_CARE_PROVIDER_SITE_OTHER): Payer: BLUE CROSS/BLUE SHIELD | Admitting: Family Medicine

## 2017-02-06 VITALS — BP 155/86 | HR 91 | Ht 67.0 in | Wt 229.0 lb

## 2017-02-06 DIAGNOSIS — M5416 Radiculopathy, lumbar region: Secondary | ICD-10-CM | POA: Diagnosis not present

## 2017-02-06 DIAGNOSIS — M545 Low back pain: Secondary | ICD-10-CM | POA: Diagnosis not present

## 2017-02-06 MED ORDER — METHYLPREDNISOLONE ACETATE 80 MG/ML IJ SUSP
80.0000 mg | Freq: Once | INTRAMUSCULAR | Status: AC
  Administered 2017-02-06: 80 mg via INTRAMUSCULAR

## 2017-02-06 MED ORDER — METHYLPREDNISOLONE SODIUM SUCC 125 MG IJ SOLR
125.0000 mg | Freq: Once | INTRAMUSCULAR | Status: AC
  Administered 2017-02-06: 125 mg via INTRAMUSCULAR

## 2017-02-06 MED ORDER — METHYLPREDNISOLONE SODIUM SUCC 125 MG IJ SOLR: 125.0000 mg | Freq: Once | INTRAMUSCULAR | 0 refills | Status: DC

## 2017-02-06 NOTE — Progress Notes (Signed)
Subjective:    Patient ID: Lance Hernandez, male    DOB: [birth year], 61 y.o.   MRN: [id number]  HPI 61 year old male comes back in today for persistent low left back pain. When I saw him a month ago he complained of left low back pain. He had a prior history of kidney stones years ago and felt like his pain was similar. We did a urinalysis which was negative at that time but to try to check tried to treat him for the kidney stones with Flomax and pain medication. He did get some relief temporarily while on the Flomax but then his pain started back again. He never passed a kidney stone so he had call back earlier this week and we decided to go ahead and get a CT for confirmation of a stone. He was essentially negative. Thus I felt like his pain was likely coming from his back so I called him an anti-inflammatory and muscle relaxer and 5 days of prednisone. He says that the prednisone Causes a rash that he did not take it. He did try a dose the Naprosyn and the Flexeril and felt like it wasn't very helpful. His pain has actually changed somewhat in nature from when I originally saw him. It is still in the left low back but he is now getting a numbness on his left outer thigh going from his hip down to about the mid thigh area. He says it's more uncomfortable to sit and feels a little bit better to stand. He is actually supposed to fly out of town next Friday about a week out and he is worried that he will not be able to sit on the airplane that long. He did get the exercises and has started those at home as well.   Review of Systems  BP (!) 155/86   Pulse 91   Ht  (1.702 m)   Wt 229 lb (103.9 kg)   SpO2 97%   BMI 35.87 kg/m     Allergies  Allergen Reactions  . Phentermine Other (See Comments)    Nausea  . Prednisone Rash  . Sulfa Antibiotics Rash  . Tamsulosin Rash  . Tramadol Rash    Past Medical History:  Diagnosis Date  . Colitis   . DJD (degenerative joint disease)   . First degree  AV block   . History of prostatitis   . Hyperlipidemia   . Hypertension   . Scoliosis     Past Surgical History:  Procedure Laterality Date  . TONSILLECTOMY      Social History   Social History  . Marital status: Married    Spouse name: susan   . Number of children: 1  . Years of education: N/A   Occupational History  . Sales     Computer work   Social History Main Topics  . Smoking status: Never Smoker  . Smokeless tobacco: Never Used  . Alcohol use Yes     Comment: occasional - wine on holidays  . Drug use: No  . Sexual activity: Yes    Partners: Female   Other Topics Concern  . Not on file   Social History Narrative   Next caffeine. No regular exercise. He does plan on joining a gym. His wife is diabetic.    Family History  Problem Relation Age of Onset  . COPD Mother     Smoker  . COPD Sister     Smoker    Outpatient Encounter Prescriptions  as of 02/06/2017  Medication Sig  . cyclobenzaprine (FLEXERIL) 10 MG tablet Take 1 tablet (10 mg total) by mouth at bedtime as needed for muscle spasms.  Marland Kitchen HYDROcodone-acetaminophen (NORCO/VICODIN) 5-325 MG tablet Take 1 tablet by mouth every 6 (six) hours as needed for moderate pain.  Marland Kitchen losartan (COZAAR) 50 MG tablet Take 1 tablet (50 mg total) by mouth daily.  . naproxen (NAPROSYN) 500 MG tablet Take 1 tablet (500 mg total) by mouth 2 (two) times daily with a meal.  . pravastatin (PRAVACHOL) 40 MG tablet take 1 tablet by mouth once daily  . [DISCONTINUED] budesonide-formoterol (SYMBICORT) 160-4.5 MCG/ACT inhaler Inhale 2 puffs into the lungs 2 (two) times daily.  . [DISCONTINUED] methylPREDNISolone sodium succinate (SOLU-MEDROL) 125 mg/2 mL injection Inject 2 mLs (125 mg total) into the muscle once.  . [DISCONTINUED] predniSONE (DELTASONE) 20 MG tablet Take 2 tablets (40 mg total) by mouth daily.  . [DISCONTINUED] tamsulosin (FLOMAX) 0.4 MG CAPS capsule Take 1 capsule (0.4 mg total) by mouth daily.  . [DISCONTINUED]  Tiotropium Bromide Monohydrate (SPIRIVA RESPIMAT) 1.25 MCG/ACT AERS Inhale 2 puffs into the lungs daily.  . [EXPIRED] methylPREDNISolone acetate (DEPO-MEDROL) injection 80 mg   . [EXPIRED] methylPREDNISolone sodium succinate (SOLU-MEDROL) 125 mg/2 mL injection 125 mg    No facility-administered encounter medications on file as of 02/06/2017.          Objective:   Physical Exam  Constitutional: He is oriented to person, place, and time. He appears well-developed and well-nourished.  HENT:  Head: Normocephalic and atraumatic.  Eyes: Conjunctivae and EOM are normal.  Cardiovascular: Normal rate.   Pulmonary/Chest: Effort normal.  Musculoskeletal:  Normal lumbar flexion and extension. He had pain with rotation to the left as well as some decreased rotation to the left on exam. Symmetric side bending though he did have pain on the left when he bent to the right and a little to the left as well. He had pain in his back with straight leg raise again on the left low back area but no radiculopathy with straight-leg raise. Hip, knee, ankle strength is 5 out of 5 bilaterally.  Neurological: He is alert and oriented to person, place, and time.  Skin: Skin is dry. No pallor.  Psychiatric: He has a normal mood and affect. His behavior is normal.  Vitals reviewed.         Assessment & Plan:  Low back pain with lumbar radiculopathy-discussed that I really think this is coming from a disc issue. Reviewed some exercises for him to start working on at home. His wife just started a new job so he is unable to afford physical therapy at this point but certainly we can consider in the future. Encouraged him to continues with the anti-inflammatory as well as the muscle relaxer over the next week to see if this is helpful or not. If not improving at that time them please let me know. Since he's unable to take oral prednisone offered to give him an injection of Solu-Medrol and Depo-Medrol. Did warn that it could  also potentially cause rash but he said he was comfortable with that and aware. He concerns take Benadryl if this occurs. At this point he would just like some relief from his pain.  We also reviewed his CT scan in detail. It did show some gallstones. Explained to him I do not think related to or causing any of his discomfort but to just be aware of it.

## 2017-02-06 NOTE — Telephone Encounter (Signed)
Left a message for a return call.

## 2017-02-07 ENCOUNTER — Telehealth: Payer: Self-pay | Admitting: Family Medicine

## 2017-02-07 NOTE — Telephone Encounter (Signed)
Pt states he was seen in office yesterday for a possible pinched nerve. He got two shots, and no relief. Pt reports the flexeril and naproxen aren't helping either. Wonders what else could be sent in for him since he can't tolerate the oral steroid. Routing to PCP for review.

## 2017-02-07 NOTE — Telephone Encounter (Signed)
We could refill a short Rx of hydrocodone and get him in withone of our sports med docs next week for further evaluation.

## 2017-02-10 ENCOUNTER — Ambulatory Visit (INDEPENDENT_AMBULATORY_CARE_PROVIDER_SITE_OTHER): Payer: BLUE CROSS/BLUE SHIELD | Admitting: Sports Medicine

## 2017-02-10 ENCOUNTER — Encounter: Payer: Self-pay | Admitting: Sports Medicine

## 2017-02-10 ENCOUNTER — Telehealth: Payer: Self-pay

## 2017-02-10 DIAGNOSIS — M51369 Other intervertebral disc degeneration, lumbar region without mention of lumbar back pain or lower extremity pain: Secondary | ICD-10-CM | POA: Insufficient documentation

## 2017-02-10 DIAGNOSIS — M5136 Other intervertebral disc degeneration, lumbar region: Secondary | ICD-10-CM | POA: Diagnosis not present

## 2017-02-10 MED ORDER — GABAPENTIN 300 MG PO CAPS: ORAL_CAPSULE | ORAL | 3 refills | Status: DC

## 2017-02-10 MED ORDER — PREDNISONE 50 MG PO TABS: ORAL_TABLET | ORAL | 0 refills | Status: DC

## 2017-02-10 NOTE — Telephone Encounter (Signed)
Transferred to scheduling to make appointment.

## 2017-02-10 NOTE — Telephone Encounter (Signed)
Pt called and said that he had an excruciating weekend with his back pain.  He is requesting a cortisone shot.  He said the shots given Friday did not help, and neither did the pain medication. Please advise.

## 2017-02-10 NOTE — Telephone Encounter (Signed)
This has been addressed by Triage nurse, Clydie Braun. Please see separate phone note.

## 2017-02-10 NOTE — Telephone Encounter (Signed)
Let get him in with one of our sports med docs.

## 2017-02-10 NOTE — Assessment & Plan Note (Signed)
Didn't get any relief from Solu-Medrol and Depo-Medrol. Doesn't have the money for physical therapy.  Switching to herniated disc rehabilitation exercises, he is only taking 20 mg of prednisone, I told him in spite of his rash we will go to a full 50 mg tab, and gabapentin for his radicular symptoms. Unfortunately he is allergic to tramadol and we will not be using any narcotics. He can return to see [place] when he gets back from [place].

## 2017-02-10 NOTE — Progress Notes (Signed)
   Subjective:    I'm seeing this patient as a consultation for:  Dr. Linford Hernandez  CC: left hip and leg pain  HPI: [name] is a 61 y.o. 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 He has had this pain for several weeks, but it worsened last Monday. The pain is worse after long periods of sitting and is only really relieved after walking around. He describes the pain as burning and numbness, and it sometimes shoots into the bottom of his foot.   He presented to the office last Thursday and received intramuscular Solu-Medrol and Depo-Medrol in the right leg to minimal relief. He is taking naproxen as well, but that hasn't helped any. He had some leftover oral prednisone from a long time ago, so he tried taking one 20 mg tablet, but didn't get any relief. Prednisone gives him a rash on his arms that he figured would be better than the back pain, but as he didn't get any relief from the prednisone he didn't take any more. Pt denies urinary or bowel symptoms.  Past medical history:  Lumbar degenerative disc disease with facet arthritis.  See flowsheet/record as well for more information.  Surgical history: Negative.  See flowsheet/record as well for more information.  Family history: Negative.  See flowsheet/record as well for more information.  Social history: Negative.  See flowsheet/record as well for more information.  Allergies, and medications have been entered into the medical record, reviewed, and no changes needed.   Review of Systems: No headache, visual changes, nausea, vomiting, diarrhea, constipation, dizziness, abdominal pain, skin rash, fevers, chills, night sweats, weight loss, swollen lymph nodes, body aches, joint swelling, muscle aches, chest pain, shortness of breath, mood changes, visual or auditory hallucinations.   Objective:   General: Well Developed, well nourished,  and in no acute distress.  Neuro/Psych: Alert and oriented x3, extra-ocular muscles intact, able to move all 4 extremities, sensation grossly intact. Skin: Warm and dry, no rashes noted.  Respiratory: Not using accessory muscles, speaking in full sentences, trachea midline.  Cardiovascular: Pulses palpable, no extremity edema. Abdomen: Does not appear distended. No CVA tenderness. MSK: pain is radicular and radiates following an L2 distribution. Full strength and ROM in all planes hip and knee joints bilaterally. Patellar and tibial reflexes 2+ bilaterally. No pain on passive internal and external left hip rotation.   Impression and Recommendations:   This case required medical decision making of moderate complexity.  [name] is a 61 y.o. Male who presents with left lumbar radiculopathy. Because the pain is severe, the rash the patient gets from the prednisone is not dangerous, and because the patient has not tried a full adult dose, patient is amenable to retrying oral prednisone at the adult dose. Pt also prescribed gabapentin as well. Patient provided with rehabilitation exercises he can do at home. Follow-up in one month.

## 2017-02-14 NOTE — Telephone Encounter (Signed)
Left detailed message on patient vm with advise as noted below. Shirla Hodgkiss,CMA  

## 2017-04-09 ENCOUNTER — Other Ambulatory Visit: Payer: Self-pay

## 2017-04-09 ENCOUNTER — Telehealth: Payer: Self-pay | Admitting: *Deleted

## 2017-04-09 DIAGNOSIS — I10 Essential (primary) hypertension: Secondary | ICD-10-CM

## 2017-04-09 DIAGNOSIS — E785 Hyperlipidemia, unspecified: Secondary | ICD-10-CM

## 2017-04-09 MED ORDER — PRAVASTATIN SODIUM 40 MG PO TABS: 40.0000 mg | ORAL_TABLET | Freq: Every day | ORAL | 3 refills | Status: DC

## 2017-04-09 MED ORDER — LOSARTAN POTASSIUM 50 MG PO TABS: 50.0000 mg | ORAL_TABLET | Freq: Every day | ORAL | 3 refills | Status: DC

## 2017-04-09 NOTE — Telephone Encounter (Signed)
Pt will get labs done @ [facility] will fax to [phone]

## 2017-04-12 LAB — COMPREHENSIVE METABOLIC PANEL
A/G RATIO: 1.8 (ref 1.2–2.2)
ALBUMIN: 4.6 g/dL (ref 3.5–5.5)
ALK PHOS: 47 IU/L (ref 39–117)
ALT: 37 IU/L (ref 0–44)
AST: 21 IU/L (ref 0–40)
BILIRUBIN TOTAL: 1.1 mg/dL (ref 0.0–1.2)
BUN / CREAT RATIO: 21 — AB (ref 9–20)
BUN: 18 mg/dL (ref 6–24)
CHLORIDE: 102 mmol/L (ref 96–106)
CO2: 25 mmol/L (ref 20–29)
Calcium: 9.4 mg/dL (ref 8.7–10.2)
Creatinine, Ser: 0.87 mg/dL (ref 0.76–1.27)
GFR calc non Af Amer: 94 mL/min/{1.73_m2} (ref >59)
GFR, EST AFRICAN AMERICAN: 109 mL/min/{1.73_m2} (ref >59)
GLOBULIN, TOTAL: 2.5 g/dL (ref 1.5–4.5)
Glucose: 97 mg/dL (ref 65–99)
Potassium: 4.7 mmol/L (ref 3.5–5.2)
SODIUM: 141 mmol/L (ref 134–144)
Total Protein: 7.1 g/dL (ref 6.0–8.5)

## 2017-04-12 LAB — LIPID PANEL
CHOLESTEROL TOTAL: 184 mg/dL (ref 100–199)
Chol/HDL Ratio: 2.5 ratio (ref 0.0–5.0)
HDL: 74 mg/dL (ref >39)
LDL Calculated: 88 mg/dL (ref 0–99)
TRIGLYCERIDES: 109 mg/dL (ref 0–149)
VLDL CHOLESTEROL CAL: 22 mg/dL (ref 5–40)

## 2017-04-22 ENCOUNTER — Telehealth: Payer: Self-pay | Admitting: Family Medicine

## 2017-04-22 NOTE — Telephone Encounter (Signed)
Patient would like Heart burn med 20 mg omniprozle approved thru [facility] they can give him 90 day supply as a good deal and he would like a call back by nurse so he can get approved. Thanks

## 2017-04-24 ENCOUNTER — Other Ambulatory Visit: Payer: Self-pay | Admitting: *Deleted

## 2017-04-24 MED ORDER — OMEPRAZOLE 20 MG PO CPDR: 20.0000 mg | DELAYED_RELEASE_CAPSULE | Freq: Every day | ORAL | 3 refills | Status: DC

## 2017-04-24 NOTE — Telephone Encounter (Signed)
Done

## 2018-01-16 ENCOUNTER — Telehealth: Payer: Self-pay | Admitting: Family Medicine

## 2018-01-16 ENCOUNTER — Telehealth: Payer: Self-pay | Admitting: *Deleted

## 2018-01-16 ENCOUNTER — Ambulatory Visit (INDEPENDENT_AMBULATORY_CARE_PROVIDER_SITE_OTHER): Payer: Managed Care, Other (non HMO) | Admitting: Family Medicine

## 2018-01-16 ENCOUNTER — Encounter: Payer: Self-pay | Admitting: Family Medicine

## 2018-01-16 VITALS — BP 158/91 | HR 63 | Ht 67.0 in | Wt 229.0 lb

## 2018-01-16 DIAGNOSIS — I10 Essential (primary) hypertension: Secondary | ICD-10-CM | POA: Diagnosis not present

## 2018-01-16 DIAGNOSIS — K649 Unspecified hemorrhoids: Secondary | ICD-10-CM | POA: Diagnosis not present

## 2018-01-16 LAB — POC HEMOCCULT BLD/STL (OFFICE/1-CARD/DIAGNOSTIC): FECAL OCCULT BLD: POSITIVE — AB

## 2018-01-16 MED ORDER — LIDOCAINE (ANORECTAL) 5 % EX CREA: 1.0000 "application " | TOPICAL_CREAM | Freq: Three times a day (TID) | CUTANEOUS | 99 refills | Status: DC | PRN

## 2018-01-16 MED ORDER — HYDROCORTISONE 2.5 % RE CREA: 1.0000 "application " | TOPICAL_CREAM | Freq: Two times a day (BID) | RECTAL | 99 refills | Status: DC

## 2018-01-16 MED ORDER — LIDOCAINE HCL 2 % EX GEL: 1.0000 "application " | Freq: Three times a day (TID) | CUTANEOUS | 99 refills | Status: DC | PRN

## 2018-01-16 NOTE — Progress Notes (Signed)
Subjective:    Patient ID: Lance Hernandez, male    DOB: 1956-02-06, 62 y.o.   MRN: [id number]  HPI 62-year-old male comes in today for acute visit for hemorrhoids.  He says he has had a flareup in the past before but he very infrequently maybe once a year.  But it came on suddenly about 3 days ago.  He rates his pain as a 3 out of 10 today.  He is been doing sitz baths about an over-the-counter equate hemorrhoid cream which has phenylephrine and pramoxizine in it.  Unfortunately he has IBS so his stool caliber does tend to change which can irritate his hemorrhoids.  Hypertension- Pt denies chest pain, SOB, dizziness, or heart palpitations.  Taking meds as directed w/o problems.  Denies medication side effects.       Review of Systems  BP (!) 158/91   Pulse 63   Ht 5\' 7"  (1.702 m)   Wt 229 lb (103.9 kg)   SpO2 97%   BMI 35.87 kg/m     Allergies  Allergen Reactions  . Phentermine Other (See Comments)    Nausea  . Prednisone Rash  . Sulfa Antibiotics Rash  . Tamsulosin Rash  . Tramadol Rash    Past Medical History:  Diagnosis Date  . Colitis   . DJD (degenerative joint disease)   . First degree AV block   . History of prostatitis   . Hyperlipidemia   . Hypertension   . Scoliosis     Past Surgical History:  Procedure Laterality Date  . TONSILLECTOMY      Social History   Socioeconomic History  . Marital status: Married    Spouse name: susan   . Number of children: 1  . Years of education: Not on file  . Highest education level: Not on file  Occupational History  . Occupation: Airline pilot    Comment: Computer work  Engineer, production  . Financial resource strain: Not on file  . Food insecurity:    Worry: Not on file    Inability: Not on file  . Transportation needs:    Medical: Not on file    Non-medical: Not on file  Tobacco Use  . Smoking status: Never Smoker  . Smokeless tobacco: Never Used  Substance and Sexual Activity  . Alcohol use: Yes    Comment: occasional  - wine on holidays  . Drug use: No  . Sexual activity: Yes    Partners: Female  Lifestyle  . Physical activity:    Days per week: Not on file    Minutes per session: Not on file  . Stress: Not on file  Relationships  . Social connections:    Talks on phone: Not on file    Gets together: Not on file    Attends religious service: Not on file    Active member of club or organization: Not on file    Attends meetings of clubs or organizations: Not on file    Relationship status: Not on file  . Intimate partner violence:    Fear of current or ex partner: Not on file    Emotionally abused: Not on file    Physically abused: Not on file    Forced sexual activity: Not on file  Other Topics Concern  . Not on file  Social History Narrative   Next caffeine. No regular exercise. He does plan on joining a gym. His wife is diabetic.    Family History  Problem Relation Age  of Onset  . COPD Mother        Smoker  . COPD Sister        Smoker    Outpatient Encounter Medications as of 01/16/2018  Medication Sig  . hydrocortisone (ANUSOL-HC) 2.5 % rectal cream Place 1 application rectally 2 (two) times daily.  . Lidocaine, Anorectal, 5 % CREA Apply 1 application topically 3 (three) times daily as needed.  Marland Kitchen. losartan (COZAAR) 50 MG tablet Take 1 tablet (50 mg total) by mouth daily.  . naproxen (NAPROSYN) 500 MG tablet Take 1 tablet (500 mg total) by mouth 2 (two) times daily with a meal.  . pravastatin (PRAVACHOL) 40 MG tablet Take 1 tablet (40 mg total) by mouth daily.  . [DISCONTINUED] cyclobenzaprine (FLEXERIL) 10 MG tablet Take 1 tablet (10 mg total) by mouth at bedtime as needed for muscle spasms.  . [DISCONTINUED] gabapentin (NEURONTIN) 300 MG capsule One tab PO qHS for a week, then BID for a week, then TID. May double weekly to a max of 3,600mg /day  . [DISCONTINUED] omeprazole (PRILOSEC) 20 MG capsule Take 1 capsule (20 mg total) by mouth daily.  . [DISCONTINUED] predniSONE (DELTASONE) 50  MG tablet One tab PO daily for 5 days.   No facility-administered encounter medications on file as of 01/16/2018.          Objective:   Physical Exam  Constitutional: He is oriented to person, place, and time. He appears well-developed and well-nourished.  HENT:  Head: Normocephalic and atraumatic.  Eyes: Conjunctivae and EOM are normal.  Cardiovascular: Normal rate, regular rhythm and normal heart sounds.  Pulmonary/Chest: Effort normal and breath sounds normal.  Genitourinary: Rectal exam shows external hemorrhoid, internal hemorrhoid, tenderness and guaiac positive stool. Rectal exam shows no mass and anal tone normal.  Genitourinary Comments: Normal rectal tone.  He has a large hemorrhoid on the right side at the 3 o'clock position that is visible externally but also goes internally.  Just distal to that he has a smaller one but not inflamed and he also has a hemorrhoid on the left side at around the 9 o'clock position that does not appear to be inflamed today.  Neurological: He is alert and oriented to person, place, and time.  Skin: Skin is warm and dry. No pallor.  Psychiatric: He has a normal mood and affect. His behavior is normal.  Vitals reviewed.     Assessment & Plan:  HTN -uncontrolled today.  He is really overdue for regular follow-ups will have him come back in the next few weeks for regular follow-up. Due for labs in June.   Hemorrhoids-discussed diagnosis.  He has a very large hemorrhoid on the right and a small on the left is not currently inflamed and in internal separate hemorrhoid.  Recommend treatment with topical hydrocortisone as well as lidocaine was a little bit stronger than what he is using over-the-counter.  Encouraged him to continue with the sitz baths.  If he is not improving over the next week please let us know.  If it becomes more problematic warts were not resolving or starts to flare frequently we can always refer to colorectal surgeon for further  treatment options.

## 2018-01-16 NOTE — Patient Instructions (Signed)
Hydrocortisone suppositories What is this medicine? HYDROCORTISONE (hye droe KOR ti sone) is a corticosteroid. It is used to decrease swelling, itching, and pain that is caused by minor skin irritations or by hemorrhoids. This medicine may be used for other purposes; ask your health care provider or pharmacist if you have questions. COMMON BRAND NAME(S): Anucort-HC, Anumed-HC, Anusol HC, Encort, GRx HiCort, Hemmorex-HC, Hemorrhoidal-HC, Hemril, Proctocort, Proctosert HC, Proctosol-HC, Rectacort HC, Rectasol-HC What should I tell my health care provider before I take this medicine? They need to know if you have any of these conditions: -an unusual or allergic reaction to hydrocortisone, corticosteroids, other medicines, foods, dyes, or preservatives -pregnant or trying to get pregnant -breast-feeding How should I use this medicine? This medicine is for rectal use only. Do not take by mouth. Wash your hands before and after use. Take off the foil wrapping. Wet the tip of the suppository with cold tap water to make it easier to use. Lie on your side with your lower leg straightened out and your upper leg bent forward toward your stomach. Lift upper buttock to expose the rectal area. Apply gentle pressure to insert the suppository completely into the rectum, pointed end first. Hold buttocks together for a few seconds. Remain lying down for about 15 minutes to avoid having the suppository come out. Do not use more often than directed. Talk to your pediatrician regarding the use of this medicine in children. Special care may be needed. Overdosage: If you think you have taken too much of this medicine contact a poison control center or emergency room at once. NOTE: This medicine is only for you. Do not share this medicine with others. What if I miss a dose? If you miss a dose, use it as soon as you can. If it is almost time for your next dose, use only that dose. Do not use double or extra doses. What may  interact with this medicine? Interactions are not expected. Do not use any other rectal products on the affected area without telling your doctor or health care professional. This list may not describe all possible interactions. Give your health care provider a list of all the medicines, herbs, non-prescription drugs, or dietary supplements you use. Also tell them if you smoke, drink alcohol, or use illegal drugs. Some items may interact with your medicine. What should I watch for while using this medicine? Visit your doctor or health care professional for regular checks on your progress. Tell your doctor or health care professional if your symptoms do not improve after a few days of use. Do not use if there is blood in your stools. If you get any type of infection while using this medicine, you may need to stop using this medicine until our infections clears up. Ask your doctor or health care professional for advice. What side effects may I notice from receiving this medicine? Side effects that you should report to your doctor or health care professional as soon as possible: -bloody or black, tarry stools -painful, red, pus filled blisters in hair follicles -rectal pain, burning or bleeding after use of medicine Side effects that usually do not require medical attention (report to your doctor or health care professional if they continue or are bothersome): -changes in skin color -dry skin -itching or irritation This list may not describe all possible side effects. Call your doctor for medical advice about side effects. You may report side effects to FDA at 1-800-FDA-1088. Where should I keep my medicine? Keep out  of the reach of children. Store at room temperature between 20 and 25 degrees C (68 and 77 degrees F). Protect from heat and freezing. Throw away any unused medicine after the expiration date. NOTE: This sheet is a summary. It may not cover all possible information. If you have questions  about this medicine, talk to your doctor, pharmacist, or health care provider.  2018 Elsevier/Gold Standard (2008-02-12 16:07:24)  Hemorrhoids Hemorrhoids are swollen veins in and around the rectum or anus. Hemorrhoids can cause pain, itching, or bleeding. Most of the time, they do not cause serious problems. They usually get better with diet changes, lifestyle changes, and other home treatments. Follow these instructions at home: Eating and drinking  Eat foods that have fiber, such as whole grains, beans, nuts, fruits, and vegetables. Ask your doctor about taking products that have added fiber (fibersupplements).  Drink enough fluid to keep your pee (urine) clear or pale yellow. For Pain and Swelling  Take a warm-water bath (sitz bath) for 20 minutes to ease pain. Do this 3-4 times a day.  If directed, put ice on the painful area. It may be helpful to use ice between your warm baths. ? Put ice in a plastic bag. ? Place a towel between your skin and the bag. ? Leave the ice on for 20 minutes, 2-3 times a day. General instructions  Take over-the-counter and prescription medicines only as told by your doctor. ? Medicated creams and medicines that are inserted into the anus (suppositories) may be used or applied as told.  Exercise often.  Go to the bathroom when you have the urge to poop (to have a bowel movement). Do not wait.  Avoid pushing too hard (straining) when you poop.  Keep the butt area dry and clean. Use wet toilet paper or moist paper towels.  Do not sit on the toilet for a long time. Contact a doctor if:  You have any of these: ? Pain and swelling that do not get better with treatment or medicine. ? Bleeding that will not stop. ? Trouble pooping or you cannot poop. ? Pain or swelling outside the area of the hemorrhoids. This information is not intended to replace advice given to you by your health care provider. Make sure you discuss any questions you have with  your health care provider. Document Released: 07/09/2008 Document Revised: 03/07/2016 Document Reviewed: 06/14/2015 Elsevier Interactive Patient Education  [employer]2018 Elsevier Inc.

## 2018-01-16 NOTE — Telephone Encounter (Signed)
Pt called and stated that he has a large sized hemorrhoid and he started using some OTC cream. I asked when he felt it got worse he stated maybe Wednesday or Thursday. He has also been doing sitz baths.  wanted to know if he should come in to have Dr. Linford ArnoldMetheney look at this.  Pt advised that he will only be seen for the hemorrhoids and will need to schedule a f/u appt for his on going medical concerns. Pt voiced understanding.Heath GoldBarkley, Abygail Galeno Lynetta, CMA

## 2018-01-16 NOTE — Telephone Encounter (Signed)
New prescription sent.  If this does not go through then we will need to call the pharmacy to see what they would recommend.

## 2018-01-16 NOTE — Telephone Encounter (Signed)
Pt called to advise he was unable to get Lidocaine, Anorectal, 5 % CRE. Requesting something else.

## 2018-01-19 NOTE — Telephone Encounter (Signed)
Pt advised, he was able to get new Rx.

## 2018-02-09 ENCOUNTER — Other Ambulatory Visit: Payer: Self-pay | Admitting: Family Medicine

## 2018-02-09 ENCOUNTER — Ambulatory Visit (INDEPENDENT_AMBULATORY_CARE_PROVIDER_SITE_OTHER): Payer: Managed Care, Other (non HMO) | Admitting: Family Medicine

## 2018-02-09 ENCOUNTER — Encounter: Payer: Self-pay | Admitting: Family Medicine

## 2018-02-09 VITALS — BP 166/80 | HR 78 | Ht 67.0 in | Wt 239.0 lb

## 2018-02-09 DIAGNOSIS — R05 Cough: Secondary | ICD-10-CM

## 2018-02-09 DIAGNOSIS — J209 Acute bronchitis, unspecified: Secondary | ICD-10-CM

## 2018-02-09 DIAGNOSIS — J440 Chronic obstructive pulmonary disease with acute lower respiratory infection: Secondary | ICD-10-CM

## 2018-02-09 DIAGNOSIS — R062 Wheezing: Secondary | ICD-10-CM | POA: Diagnosis not present

## 2018-02-09 DIAGNOSIS — R042 Hemoptysis: Secondary | ICD-10-CM

## 2018-02-09 DIAGNOSIS — R059 Cough, unspecified: Secondary | ICD-10-CM

## 2018-02-09 MED ORDER — HYDROCODONE-HOMATROPINE 5-1.5 MG/5ML PO SYRP: 5.0000 mL | ORAL_SOLUTION | Freq: Every evening | ORAL | 0 refills | Status: DC | PRN

## 2018-02-09 MED ORDER — AZITHROMYCIN 250 MG PO TABS: ORAL_TABLET | ORAL | 0 refills | Status: AC

## 2018-02-09 MED ORDER — ALBUTEROL SULFATE (2.5 MG/3ML) 0.083% IN NEBU
2.5000 mg | INHALATION_SOLUTION | Freq: Once | RESPIRATORY_TRACT | Status: AC
  Administered 2018-02-09: 2.5 mg via RESPIRATORY_TRACT

## 2018-02-09 NOTE — Progress Notes (Signed)
Subjective:    Patient ID: Lance Hernandez, male    DOB: [birth year], 62 y.o.   MRN: [id number]  HPI 62-year-old male comes in today complaining of coughing  That hasxbeen persistant since he was dx with the flu and was treated with tamfilu.  He has been up blood and mucous in the mornings. + post nasal drip.  He tried flonase for about 1.5 weeks.  He has noticed some wheezing for about 2 weeks as well.  he did have a fever around the time he was diagnosed with influenza but has not had one since.  So that is keeping him awake at night.  Review of Systems   BP (!) 166/80 Comment: pt reports that he takes his medication at night  Pulse 78   Ht  (1.702 m)   Wt 239 lb (108.4 kg)   SpO2 97%   PF 410 L/min Comment: green zone 392-400  BMI 37.43 kg/m     Allergies  Allergen Reactions  . Phentermine Other (See Comments)    Nausea  . Prednisone Rash  . Sulfa Antibiotics Rash  . Tamsulosin Rash  . Tramadol Rash    Past Medical History:  Diagnosis Date  . Colitis   . DJD (degenerative joint disease)   . First degree AV block   . History of prostatitis   . Hyperlipidemia   . Hypertension   . Scoliosis     Past Surgical History:  Procedure Laterality Date  . TONSILLECTOMY      Social History   Socioeconomic History  . Marital status: Married    Spouse name: susan   . Number of children: 1  . Years of education: Not on file  . Highest education level: Not on file  Occupational History  . Occupation: Airline pilot    Comment: Computer work  Engineer, production  . Financial resource strain: Not on file  . Food insecurity:    Worry: Not on file    Inability: Not on file  . Transportation needs:    Medical: Not on file    Non-medical: Not on file  Tobacco Use  . Smoking status: Never Smoker  . Smokeless tobacco: Never Used  Substance and Sexual Activity  . Alcohol use: Yes    Comment: occasional - wine on holidays  . Drug use: No  . Sexual activity: Yes    Partners: Female   Lifestyle  . Physical activity:    Days per week: Not on file    Minutes per session: Not on file  . Stress: Not on file  Relationships  . Social connections:    Talks on phone: Not on file    Gets together: Not on file    Attends religious service: Not on file    Active member of club or organization: Not on file    Attends meetings of clubs or organizations: Not on file    Relationship status: Not on file  . Intimate partner violence:    Fear of current or ex partner: Not on file    Emotionally abused: Not on file    Physically abused: Not on file    Forced sexual activity: Not on file  Other Topics Concern  . Not on file  Social History Narrative   Next caffeine. No regular exercise. He does plan on joining a gym. His wife is diabetic.    Family History  Problem Relation Age of Onset  . COPD Mother  Smoker  . COPD Sister        Smoker    Outpatient Encounter Medications as of 02/09/2018  Medication Sig  . losartan (COZAAR) 50 MG tablet Take 1 tablet (50 mg total) by mouth daily.  . pravastatin (PRAVACHOL) 40 MG tablet Take 1 tablet (40 mg total) by mouth daily.  Marland Kitchen azithromycin (ZITHROMAX) 250 MG tablet 2 Ttabs PO on Day 1, then one a day x 4 days.  Marland Kitchen HYDROcodone-homatropine (HYCODAN) 5-1.5 MG/5ML syrup Take 5 mLs by mouth at bedtime as needed for cough.  . [DISCONTINUED] hydrocortisone (ANUSOL-HC) 2.5 % rectal cream Place 1 application rectally 2 (two) times daily.  . [DISCONTINUED] lidocaine (XYLOCAINE) 2 % jelly Apply 1 application topically 3 (three) times daily as needed.  . [DISCONTINUED] Lidocaine, Anorectal, 5 % CREA Apply 1 application topically 3 (three) times daily as needed.  . [DISCONTINUED] naproxen (NAPROSYN) 500 MG tablet Take 1 tablet (500 mg total) by mouth 2 (two) times daily with a meal.  . [EXPIRED] albuterol (PROVENTIL) (2.5 MG/3ML) 0.083% nebulizer solution 2.5 mg    No facility-administered encounter medications on file as of 02/09/2018.            Objective:   Physical Exam  Constitutional: He is oriented to person, place, and time. He appears well-developed and well-nourished.  HENT:  Head: Normocephalic and atraumatic.  Right Ear: External ear normal.  Left Ear: External ear normal.  Nose: Nose normal.  Mouth/Throat: Oropharynx is clear and moist.  TMs and canals are clear.   Eyes: Pupils are equal, round, and reactive to light. Conjunctivae and EOM are normal.  Neck: Neck supple. No thyromegaly present.  Cardiovascular: Normal rate and normal heart sounds.  Pulmonary/Chest: Effort normal. He has wheezes.  Diffuse expiratory wheezing at the bases of lung bilateally.   Lymphadenopathy:    He has no cervical adenopathy.  Neurological: He is alert and oriented to person, place, and time.  Skin: Skin is warm and dry.  Psychiatric: He has a normal mood and affect.         Assessment & Plan:  Acute bronchitis x 3 weeks not improving.  Will tx with zpack. Intolerant to prednisone. No sign improvement after albuterol neb  If not better by the end of the week then will get CXR and schedule spirometry.   Blood in sputum - small amount. Likely from excess coughing. If not better by end of week then will evaluate for TB and get CXR.

## 2018-02-13 ENCOUNTER — Other Ambulatory Visit: Payer: Self-pay

## 2018-02-13 ENCOUNTER — Ambulatory Visit (INDEPENDENT_AMBULATORY_CARE_PROVIDER_SITE_OTHER): Payer: Managed Care, Other (non HMO)

## 2018-02-13 DIAGNOSIS — R059 Cough, unspecified: Secondary | ICD-10-CM

## 2018-02-13 DIAGNOSIS — R062 Wheezing: Secondary | ICD-10-CM

## 2018-02-13 DIAGNOSIS — R05 Cough: Secondary | ICD-10-CM

## 2018-02-13 DIAGNOSIS — R042 Hemoptysis: Secondary | ICD-10-CM | POA: Diagnosis not present

## 2018-02-18 ENCOUNTER — Ambulatory Visit (INDEPENDENT_AMBULATORY_CARE_PROVIDER_SITE_OTHER): Payer: Managed Care, Other (non HMO) | Admitting: Family Medicine

## 2018-02-18 VITALS — BP 162/94 | HR 79 | Temp 98.0°F | Resp 18 | Ht 66.0 in | Wt 234.0 lb

## 2018-02-18 DIAGNOSIS — Z111 Encounter for screening for respiratory tuberculosis: Secondary | ICD-10-CM | POA: Diagnosis not present

## 2018-02-18 DIAGNOSIS — J449 Chronic obstructive pulmonary disease, unspecified: Secondary | ICD-10-CM

## 2018-02-18 DIAGNOSIS — R0602 Shortness of breath: Secondary | ICD-10-CM | POA: Diagnosis not present

## 2018-02-18 DIAGNOSIS — R062 Wheezing: Secondary | ICD-10-CM

## 2018-02-18 DIAGNOSIS — R042 Hemoptysis: Secondary | ICD-10-CM

## 2018-02-18 MED ORDER — ALBUTEROL SULFATE (2.5 MG/3ML) 0.083% IN NEBU: 2.5000 mg | INHALATION_SOLUTION | Freq: Once | RESPIRATORY_TRACT | Status: DC

## 2018-02-18 MED ORDER — FLUTICASONE FUROATE-VILANTEROL 100-25 MCG/INH IN AEPB: 1.0000 | INHALATION_SPRAY | Freq: Every day | RESPIRATORY_TRACT | 1 refills | Status: DC

## 2018-02-18 NOTE — Progress Notes (Signed)
   Subjective:    Patient ID: Lance Hernandez, male    DOB: May 28, 1956, 62 y.o.   MRN: [id number]  HPI  62-year-old male is here today for spirometry.  He is been seen a couple of times recently for increased cough and shortness of breath.  He was diagnosed with the flu this winter and treated with Tamiflu but unfortunately kept a persistent cough.  He is also been coughing up some occasional blood and mucus.  Last saw him on April 29 and put him on a Z-Pak.  He is intolerant to prednisone.  He had a chest x-ray done on May 3 that showed no acute abnormality's but some slight scarring at the lung bases and we had him come in today for spirometry.  Is never been a smoker.  Though he does continue to cough.  Is concerned because he continues to have lots of drainage and sputum that he says is very thick and sticky.  He still waking up with a little bit of blood in his throat in the mornings.  And still noticing intermittent wheezing and shortness of breath.  Review of Systems     Objective:   Physical Exam  Constitutional: He is oriented to person, place, and time. He appears well-developed and well-nourished.  HENT:  Head: Normocephalic and atraumatic.  Cardiovascular: Normal rate, regular rhythm and normal heart sounds.  Pulmonary/Chest: Effort normal and breath sounds normal.  Neurological: He is alert and oriented to person, place, and time.  Skin: Skin is warm and dry.  Psychiatric: He has a normal mood and affect. His behavior is normal.          Assessment & Plan:  Chronic cough-here today for spirometry.  Results show a good curve so he did follow instructions well.  FVC of 65% with FEV1 of 49% ratio 56%, consistant with severe obstruction.  No significant improvement with albuterol.  Most consistent with severe obstruction.  We will go ahead and take place a TB skin test since he has had some bloody sputum first thing in the morning.  We will also go ahead and order chest CT for further  work-up.  We will have him do a 6-minute walk test today.this was normal.  Will have him complete a CAT score questionnaire at f/U in 3 week.    Blood in sputum - TB skin test performed.  Willl order chest CT for further w/u.

## 2018-02-18 NOTE — Progress Notes (Signed)
   Subjective:    Patient ID: Lance Hernandez, male    DOB: 18-Jun-1956, 62 y.o.   MRN: [id number]  HPI Patient here for spirometry. Has had some shortness of breath during recent high pollen season. No smoking in history except for second hand smoke.    Review of Systems     Objective:   Physical Exam        Assessment & Plan:

## 2018-02-19 ENCOUNTER — Telehealth: Payer: Self-pay

## 2018-02-19 NOTE — Telephone Encounter (Signed)
OK, I understand.  Thank you for letting me know.

## 2018-02-19 NOTE — Telephone Encounter (Signed)
Patient advised.

## 2018-02-19 NOTE — Telephone Encounter (Signed)
Leory called to say he is not going to have the CT of the chest. He reports his cost will be $500.

## 2018-02-20 ENCOUNTER — Ambulatory Visit (INDEPENDENT_AMBULATORY_CARE_PROVIDER_SITE_OTHER): Payer: Managed Care, Other (non HMO) | Admitting: Family Medicine

## 2018-02-20 VITALS — BP 136/88 | HR 67

## 2018-02-20 DIAGNOSIS — Z111 Encounter for screening for respiratory tuberculosis: Secondary | ICD-10-CM

## 2018-02-20 LAB — TB SKIN TEST
Induration: 0 mm
TB Skin Test: NEGATIVE

## 2018-02-20 NOTE — Progress Notes (Signed)
Agree with documentation as above.   [name], MD  

## 2018-02-20 NOTE — Progress Notes (Signed)
Pt came into clinic today for PPD read. There was some bruising on the skin, but no knot underneath. PPD test was negative. Pt advised to keep his upcoming follow up with PCP, no changes at this time.

## 2018-02-22 ENCOUNTER — Other Ambulatory Visit: Payer: Self-pay | Admitting: Family Medicine

## 2018-03-03 ENCOUNTER — Telehealth: Payer: Self-pay | Admitting: Family Medicine

## 2018-03-03 NOTE — Telephone Encounter (Signed)
Patient called advised that he has seen you recently for coughing and Copd concerns and he is not any better. Pt states that he has stuffy nose but it still runs and would like to know if you can call him in something or is there something he can get over the counter. I adv pt need appointment but he said has already had recent conversations in prevs appts about the same issue. I told pt will send a phone message.

## 2018-03-03 NOTE — Telephone Encounter (Signed)
Routing to PCP for review.

## 2018-03-04 MED ORDER — AZITHROMYCIN 250 MG PO TABS: ORAL_TABLET | ORAL | 0 refills | Status: AC

## 2018-03-04 MED ORDER — AMOXICILLIN-POT CLAVULANATE 875-125 MG PO TABS: 1.0000 | ORAL_TABLET | Freq: Two times a day (BID) | ORAL | 0 refills | Status: DC

## 2018-03-04 NOTE — Telephone Encounter (Signed)
Called pharmacy and they stated pt had already picked up both prescriptions. LM on pt VM to return call to office in regards to not take the Zpak as he was on this previously. Should only take the Augmentin. Call back number provided. KG LPN

## 2018-03-04 NOTE — Telephone Encounter (Signed)
Call [facility] and discontinue the Z-Pak.  I sent a prescription but realize he had just taken it a few weeks ago.  New prescription sent for Augmentin.

## 2018-03-04 NOTE — Telephone Encounter (Signed)
Pt called office back- he had already taken 2 pills from Z-pak. I advised pt to discontinue Z-pak and only take Augmentin. Pt repeated directions in understanding and will discontinue Z-pak.   No further needs at this time. Pt states he has an appt after Memorial Day and will follow up then.

## 2018-03-11 ENCOUNTER — Encounter: Payer: Self-pay | Admitting: Family Medicine

## 2018-03-11 ENCOUNTER — Ambulatory Visit (INDEPENDENT_AMBULATORY_CARE_PROVIDER_SITE_OTHER): Payer: Managed Care, Other (non HMO) | Admitting: Family Medicine

## 2018-03-11 VITALS — BP 158/81 | HR 91 | Ht 66.0 in | Wt 233.0 lb

## 2018-03-11 DIAGNOSIS — J449 Chronic obstructive pulmonary disease, unspecified: Secondary | ICD-10-CM

## 2018-03-11 DIAGNOSIS — J0101 Acute recurrent maxillary sinusitis: Secondary | ICD-10-CM

## 2018-03-11 MED ORDER — CEFDINIR 300 MG PO CAPS: 300.0000 mg | ORAL_CAPSULE | Freq: Two times a day (BID) | ORAL | 0 refills | Status: DC

## 2018-03-11 MED ORDER — FLUTICASONE PROPIONATE 50 MCG/ACT NA SUSP: 2.0000 | Freq: Every day | NASAL | 6 refills | Status: DC

## 2018-03-11 NOTE — Progress Notes (Signed)
   Subjective:    Patient ID: Lance Hernandez, male    DOB: [birth year], 62 y.o.   MRN: [id number]  HPI  Cough is better. His right is bothering him along with some nasal congestion. .  No fever.  Says the Augmentin didn't seem to help him.  He had already take a zpack.   He had a chest x-ray done on May 3 that showed no acute abnormality's but some slight scarring at the lung bases. He is on the [place].  He did do a 6-minute walk test when last year as well as had a negative TB skin test placed.  He was unable to afford the chest CT because of high co-pay.  Spirometry was consistent with severe obstruction performed on May 8. Right ear is crackling.  He is been having sneezing attacks.  Not sure the Virgel Bouquet is helping.  He reports his cough is somewhat better and is no longer coughing up blood.  Review of Systems     Objective:   Physical Exam  Constitutional: He is oriented to person, place, and time. He appears well-developed and well-nourished.  HENT:  Head: Normocephalic and atraumatic.  Right Ear: External ear normal.  Left Ear: External ear normal.  Nose: Nose normal.  Mouth/Throat: Oropharynx is clear and moist.  TMs and canals are clear. Turbinates are pale and swollen.    Eyes: Pupils are equal, round, and reactive to light. Conjunctivae and EOM are normal.  Neck: Neck supple. No thyromegaly present.  Cardiovascular: Normal rate and normal heart sounds.  Pulmonary/Chest: Effort normal and breath sounds normal.  Lymphadenopathy:    He has no cervical adenopathy.  Neurological: He is alert and oriented to person, place, and time.  Skin: Skin is warm and dry.  Psychiatric: He has a normal mood and affect.       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 Continue with Breo.  Has had some dec in cough and no longer seeing blood sputum.    Acute recurrent sinusitis-we will discontinue Augmentin and put him on Omnicef.  Can start fluticasone as well because he has  significantly swollen and pale turbinates.  Call if not better in 10 days.

## 2018-04-06 ENCOUNTER — Telehealth: Payer: Self-pay

## 2018-04-06 DIAGNOSIS — J324 Chronic pansinusitis: Secondary | ICD-10-CM

## 2018-04-06 NOTE — Telephone Encounter (Signed)
Pt called- he states he has been battling a sinus/congestion issue for several months. Pt reports at last OV, he was Cefdinir, and it did not help. He tried using Flonase and it did not help any either.   Pt states main concern is stuff/congested nose, sneezing a lot, and pressure in right ear. He has called ENT to see about getting in but they are booking out into September.   Pt will lose his insurance June 30th due to his wife changing jobs and wanted to know if there was any other medication Dr Linford ArnoldMetheney could give him to treat these ongoing SX.

## 2018-04-06 NOTE — Telephone Encounter (Signed)
Since he has failed multiple treatment options the next recommendation will be to do a limited CT sinus for further evaluation.  See if he is okay with doing that I can place the order today and we can get it done this week.

## 2018-04-06 NOTE — Telephone Encounter (Signed)
Pt advised and is OK with CT scan. He would like to have it done this week, his insurance expires on 04/12/18.

## 2018-04-22 ENCOUNTER — Other Ambulatory Visit: Payer: Self-pay | Admitting: Family Medicine

## 2018-04-28 ENCOUNTER — Other Ambulatory Visit: Payer: Self-pay | Admitting: *Deleted

## 2018-04-28 MED ORDER — PRAVASTATIN SODIUM 40 MG PO TABS: 40.0000 mg | ORAL_TABLET | Freq: Every day | ORAL | 1 refills | Status: DC

## 2018-04-28 MED ORDER — LOSARTAN POTASSIUM 50 MG PO TABS: 50.0000 mg | ORAL_TABLET | Freq: Every day | ORAL | 1 refills | Status: DC

## 2018-07-20 ENCOUNTER — Other Ambulatory Visit: Payer: Self-pay

## 2018-07-20 MED ORDER — PRAVASTATIN SODIUM 40 MG PO TABS: 40.0000 mg | ORAL_TABLET | Freq: Every day | ORAL | 1 refills | Status: DC

## 2018-07-20 MED ORDER — OMEPRAZOLE 20 MG PO CPDR: 20.0000 mg | DELAYED_RELEASE_CAPSULE | Freq: Every day | ORAL | 3 refills | Status: DC

## 2018-07-20 MED ORDER — LOSARTAN POTASSIUM 50 MG PO TABS: 50.0000 mg | ORAL_TABLET | Freq: Every day | ORAL | 1 refills | Status: DC

## 2018-12-28 ENCOUNTER — Other Ambulatory Visit: Payer: Self-pay

## 2018-12-28 MED ORDER — PRAVASTATIN SODIUM 40 MG PO TABS: 40.0000 mg | ORAL_TABLET | Freq: Every day | ORAL | 0 refills | Status: DC

## 2018-12-28 MED ORDER — LOSARTAN POTASSIUM 50 MG PO TABS: 50.0000 mg | ORAL_TABLET | Freq: Every day | ORAL | 0 refills | Status: DC

## 2018-12-29 ENCOUNTER — Other Ambulatory Visit: Payer: Self-pay | Admitting: Family Medicine

## 2019-02-23 ENCOUNTER — Encounter: Payer: Self-pay | Admitting: Family Medicine

## 2019-02-23 ENCOUNTER — Ambulatory Visit (INDEPENDENT_AMBULATORY_CARE_PROVIDER_SITE_OTHER): Payer: BLUE CROSS/BLUE SHIELD | Admitting: Family Medicine

## 2019-02-23 ENCOUNTER — Telehealth: Payer: Self-pay | Admitting: Family Medicine

## 2019-02-23 VITALS — BP 140/93 | HR 75 | Ht 66.0 in

## 2019-02-23 DIAGNOSIS — E785 Hyperlipidemia, unspecified: Secondary | ICD-10-CM

## 2019-02-23 DIAGNOSIS — J449 Chronic obstructive pulmonary disease, unspecified: Secondary | ICD-10-CM | POA: Diagnosis not present

## 2019-02-23 DIAGNOSIS — I10 Essential (primary) hypertension: Secondary | ICD-10-CM | POA: Diagnosis not present

## 2019-02-23 MED ORDER — LOSARTAN POTASSIUM 50 MG PO TABS: 50.0000 mg | ORAL_TABLET | Freq: Every day | ORAL | 1 refills | Status: DC

## 2019-02-23 MED ORDER — PRAVASTATIN SODIUM 40 MG PO TABS: 40.0000 mg | ORAL_TABLET | Freq: Every day | ORAL | 3 refills | Status: DC

## 2019-02-23 MED ORDER — FLUTICASONE FUROATE-VILANTEROL 100-25 MCG/INH IN AEPB: 1.0000 | INHALATION_SPRAY | Freq: Every day | RESPIRATORY_TRACT | 5 refills | Status: DC

## 2019-02-23 NOTE — Progress Notes (Signed)
Called pt and he wanted to be sure that he wouldn't be charged for this visit today because his insurance will only pay for 1 visit per year. I advise him that usually those visits are reserved for CPE only. He informed me that they also have new insurance and right now things are not going so well for his household at this time.   He reports that his COPD has gotten worse and he was unable to get his refills for the [facility]. He wanted to know if there is something that is less expensive that he can get than the [facility].Laureen Ochs, Viann Shove, CMA

## 2019-02-23 NOTE — Telephone Encounter (Signed)
Medication: Breo Ellipta 100-25MCG/INH Aerosol Powder is not covered [employer].   Key: [id number]  [employer]

## 2019-02-23 NOTE — Progress Notes (Signed)
Virtual Visit via Telephone Note  I connected with Lance Hernandez on 02/24/19 at  3:00 PM EDT by a telephone application and verified that I am speaking with the correct person using two identifiers.   I discussed the limitations of evaluation and management by telemedicine and the availability of in person appointments. The patient expressed understanding and agreed to proceed.  Pt was at home and I was in my office for the virtual visit.     Subjective:    CC: BP and COPD  HPI:  Hypertension- Pt denies chest pain, SOB, dizziness, or heart palpitations.  Taking meds as directed w/o problems.  Denies medication side effects.    F/U COPD - He feels his symptoms have worsening over the last year  has had some coughing and phlegm production and has been sneezing a lot.  He feels this is his baseline.  Occ sees some bright red blood in the morning.  He ran ot of the [place]. He has gained some weight as well.    Hyperlipidemia - tolerating stating well with no myalgias or significant side effects.   Lab Results  Component Value Date   CHOL 184 04/11/2017   CHOL 213 (H) 04/23/2016   CHOL 197 05/02/2015   Lab Results  Component Value Date   HDL 74 04/11/2017   HDL 64 04/23/2016   HDL 66 05/02/2015   Lab Results  Component Value Date   LDLCALC 88 04/11/2017   LDLCALC 124 04/23/2016   LDLCALC 107 (H) 05/02/2015   Lab Results  Component Value Date   TRIG 109 04/11/2017   TRIG 124 04/23/2016   TRIG 119 05/02/2015   Lab Results  Component Value Date   CHOLHDL 2.5 04/11/2017   CHOLHDL 3.3 04/23/2016   CHOLHDL 3.0 05/02/2015   No results found for: LDLDIRECT     Past medical history, Surgical history, Family history not pertinant except as noted below, Social history, Allergies, and medications have been entered into the medical record, reviewed, and corrections made.   Review of Systems: No fevers, chills, night sweats, weight loss, chest pain, or shortness of breath.    Objective:    General: Speaking clearly in complete sentences without any shortness of breath.  Alert and oriented x3.  Normal judgment. No apparent acute distress.    Impression and Recommendations:    HTN - elevated today. Will restart medication.  New medication sent to pharmacy. Continue current regimen. Encouraged him to work on weight loss as ell. Goal BP less than 130 . He says it was better this morning.   COPD - does feel like has gotten worse over the last year. No recetn exacerbations I would like to have him back in to update his spirometry tests but he is worried about racking up large medical bills.  For now will refill his medication.  Call if to expensive. Advair has a generic now tha tmight be cheaper if he has a deductible.    Hyperlipidemia - due to recheck lipids. Will refill medication. Encourage him to go to the lab this summer.    Time spent on the encounter 15 minutes.    I discussed the assessment and treatment plan with the patient. The patient was provided an opportunity to ask questions and all were answered. The patient agreed with the plan and demonstrated an understanding of the instructions.   The patient was advised to call back or seek an in-person evaluation if the symptoms worsen or if the condition  fails to improve as anticipated.   Beatrice Lecher, MD

## 2019-02-24 MED ORDER — FLUTICASONE-SALMETEROL 250-50 MCG/DOSE IN AEPB: 1.0000 | INHALATION_SPRAY | Freq: Two times a day (BID) | RESPIRATORY_TRACT | 5 refills | Status: DC

## 2019-02-24 NOTE — Telephone Encounter (Signed)
This medication may be excluded from the patient's benefit. For more information, please reach out to [employer] directly at [phone]. Message from [employer]: Drug is not covered by plan.  Do you have any other recommendations? Please advise.

## 2019-02-24 NOTE — Telephone Encounter (Signed)
Call pt: we will try advair/generic advair.  New rx sent to [facility]. Do we have his correct insurance on file?

## 2019-02-25 NOTE — Telephone Encounter (Signed)
Patient is ok with taking the Advair and he will let us know if he thinks it does not work.  Patient has been talking to his insurance and he will get labs at his convenience. I advised him that we would call him with results once completed.

## 2019-03-02 DIAGNOSIS — E785 Hyperlipidemia, unspecified: Secondary | ICD-10-CM | POA: Diagnosis not present

## 2019-03-02 DIAGNOSIS — I10 Essential (primary) hypertension: Secondary | ICD-10-CM | POA: Diagnosis not present

## 2019-03-03 ENCOUNTER — Encounter: Payer: Self-pay | Admitting: Family Medicine

## 2019-03-03 DIAGNOSIS — R7301 Impaired fasting glucose: Secondary | ICD-10-CM | POA: Insufficient documentation

## 2019-03-03 DIAGNOSIS — E118 Type 2 diabetes mellitus with unspecified complications: Secondary | ICD-10-CM | POA: Insufficient documentation

## 2019-03-03 LAB — COMPLETE METABOLIC PANEL WITH GFR
AG Ratio: 1.5 (calc) (ref 1.0–2.5)
ALT: 40 U/L (ref 9–46)
AST: 28 U/L (ref 10–35)
Albumin: 4.6 g/dL (ref 3.6–5.1)
Alkaline phosphatase (APISO): 51 U/L (ref 35–144)
BUN: 18 mg/dL (ref 7–25)
CO2: 30 mmol/L (ref 20–32)
Calcium: 9.8 mg/dL (ref 8.6–10.3)
Chloride: 103 mmol/L (ref 98–110)
Creat: 0.87 mg/dL (ref 0.70–1.25)
GFR, Est African American: 108 mL/min/{1.73_m2} (ref >=60)
GFR, Est Non African American: 93 mL/min/{1.73_m2} (ref >=60)
Globulin: 3 g/dL (calc) (ref 1.9–3.7)
Glucose, Bld: 109 mg/dL — ABNORMAL HIGH (ref 65–99)
Potassium: 4.1 mmol/L (ref 3.5–5.3)
Sodium: 140 mmol/L (ref 135–146)
Total Bilirubin: 1 mg/dL (ref 0.2–1.2)
Total Protein: 7.6 g/dL (ref 6.1–8.1)

## 2019-03-03 LAB — CBC
HCT: 45.8 % (ref 38.5–50.0)
Hemoglobin: 15.7 g/dL (ref 13.2–17.1)
MCH: 31.2 pg (ref 27.0–33.0)
MCHC: 34.3 g/dL (ref 32.0–36.0)
MCV: 90.9 fL (ref 80.0–100.0)
MPV: 10.4 fL (ref 7.5–12.5)
Platelets: 224 10*3/uL (ref 140–400)
RBC: 5.04 10*6/uL (ref 4.20–5.80)
RDW: 12.4 % (ref 11.0–15.0)
WBC: 4.3 10*3/uL (ref 3.8–10.8)

## 2019-03-03 LAB — LIPID PANEL
Cholesterol: 231 mg/dL — ABNORMAL HIGH (ref <200)
HDL: 65 mg/dL (ref >=40)
LDL Cholesterol (Calc): 141 mg/dL (calc) — ABNORMAL HIGH
Non-HDL Cholesterol (Calc): 166 mg/dL (calc) — ABNORMAL HIGH (ref <130)
Total CHOL/HDL Ratio: 3.6 (calc) (ref <5.0)
Triglycerides: 126 mg/dL (ref <150)

## 2019-03-03 LAB — HEMOGLOBIN A1C
Hgb A1c MFr Bld: 5.7 % of total Hgb — ABNORMAL HIGH (ref <5.7)
Mean Plasma Glucose: 117 (calc)
eAG (mmol/L): 6.5 (calc)

## 2019-03-03 MED ORDER — ATORVASTATIN CALCIUM 40 MG PO TABS: 40.0000 mg | ORAL_TABLET | Freq: Every day | ORAL | 3 refills | Status: DC

## 2019-03-03 NOTE — Addendum Note (Signed)
Addended by: Nani Gasser D on: 03/03/2019 01:46 PM   Modules accepted: Orders

## 2019-03-23 ENCOUNTER — Telehealth: Payer: Self-pay

## 2019-03-23 NOTE — Telephone Encounter (Signed)
This medication may be excluded from the patient's benefit. For more information, please reach out to [employer] directly at [phone]. Message from [employer]: Drug is not covered by plan.   I then found out that Combivent was covered and now its a plan exclusion. Do you have any recommendations? Please advise.

## 2019-03-23 NOTE — Telephone Encounter (Signed)
Prior Auth needed fir Advair   Key: [id number]

## 2019-03-24 NOTE — Telephone Encounter (Signed)
Patient's wife advised of recommendations. She will find out and call us back.

## 2019-03-24 NOTE — Telephone Encounter (Signed)
I would recommend that he call his insurance company and find out which is better covered Advair, Dulera, Breo, or Combivent?  Advair also has a generic called Wixela Inhub which is available and may be cheaper as well.

## 2019-04-27 ENCOUNTER — Telehealth: Payer: Self-pay

## 2019-04-27 MED ORDER — AMOXICILLIN-POT CLAVULANATE 875-125 MG PO TABS: 1.0000 | ORAL_TABLET | Freq: Two times a day (BID) | ORAL | 0 refills | Status: DC

## 2019-04-27 NOTE — Telephone Encounter (Signed)
Has been using his Flonase or Nasonex spray?  That usually will help with the fullness sensation.

## 2019-04-27 NOTE — Telephone Encounter (Signed)
Yes, pt reports taking Flonase and some other OTC medications recommended by the pharmacist

## 2019-04-27 NOTE — Telephone Encounter (Signed)
Patient calls with complaints of ear pain. Reports its been worsening over the last week or two. Mostly in the right ear but also some in the left. He is still having post nasal drip and lots of sneezing, but states this has been going on for a while and PCP is already aware.   Wanting to know if RX can be called in to help the feeling of ear fullness

## 2019-04-27 NOTE — Telephone Encounter (Signed)
Pt advised.

## 2019-04-27 NOTE — Telephone Encounter (Signed)
Send in Augmentin. If not helping then needs OV>

## 2019-05-07 ENCOUNTER — Ambulatory Visit (INDEPENDENT_AMBULATORY_CARE_PROVIDER_SITE_OTHER): Payer: BC Managed Care – PPO | Admitting: Family Medicine

## 2019-05-07 ENCOUNTER — Encounter: Payer: Self-pay | Admitting: Family Medicine

## 2019-05-07 ENCOUNTER — Other Ambulatory Visit: Payer: Self-pay

## 2019-05-07 VITALS — BP 144/90 | HR 78 | Ht 66.0 in | Wt 240.0 lb

## 2019-05-07 DIAGNOSIS — H938X3 Other specified disorders of ear, bilateral: Secondary | ICD-10-CM | POA: Diagnosis not present

## 2019-05-07 DIAGNOSIS — J3489 Other specified disorders of nose and nasal sinuses: Secondary | ICD-10-CM | POA: Diagnosis not present

## 2019-05-07 DIAGNOSIS — R067 Sneezing: Secondary | ICD-10-CM | POA: Diagnosis not present

## 2019-05-07 DIAGNOSIS — I10 Essential (primary) hypertension: Secondary | ICD-10-CM | POA: Diagnosis not present

## 2019-05-07 MED ORDER — MONTELUKAST SODIUM 10 MG PO TABS: 10.0000 mg | ORAL_TABLET | Freq: Every day | ORAL | 3 refills | Status: DC

## 2019-05-07 MED ORDER — IPRATROPIUM BROMIDE 0.06 % NA SOLN: 2.0000 | Freq: Three times a day (TID) | NASAL | 3 refills | Status: DC

## 2019-05-07 NOTE — Progress Notes (Signed)
Acute Office Visit  Subjective:    Patient ID: Lance Hernandez, male    DOB: 02/21/1956, 63 y.o.   MRN: [id number]  Chief Complaint  Patient presents with  . Ear Fullness    R ear he has had some sinus drainage he has tried some OTC allergy meds which didn't help    HPI Patient is in today for ear fullness though he feels like it is worse on the right.  It is been going on for several months at this point.  He even saw ENT at one point.  We did try recent Augmentin treatment but says he really did not notice any improvement.  He is also been trying Flonase and did notice any significant provement with that either.  He is also tried several over-the-counter antihistamines, none of which seem to really be helpful.  He also reports that he gets an occasional rash that seems to always start under his axillary area.  He is had that on and off for years even going back to when [place] lived in [place]  He says that he only had ear pain about 8 to 10 days ago now is really just more fullness and some pressure.  He says he always has nasal congestion as well as significant postnasal drip and frequent sneezing.  He is very stressed as he does not currently have a job and has not been able to find one.  He is actually been thinking about maybe applying for disability.  He currently has health insurance through his wife but has an extremely high deductible.  Past Medical History:  Diagnosis Date  . Colitis   . DJD (degenerative joint disease)   . First degree AV block   . History of prostatitis   . Hyperlipidemia   . Hypertension   . Scoliosis     Past Surgical History:  Procedure Laterality Date  . TONSILLECTOMY      Family History  Problem Relation Age of Onset  . COPD Mother        Smoker  . COPD Sister        Smoker    Social History   Socioeconomic History  . Marital status: Married    Spouse name: susan   . Number of children: 1  . Years of education: Not on file  . Highest  education level: Not on file  Occupational History  . Occupation: Airline pilotales    Comment: Computer work  Engineer, productionocial Needs  . Financial resource strain: Not on file  . Food insecurity    Worry: Not on file    Inability: Not on file  . Transportation needs    Medical: Not on file    Non-medical: Not on file  Tobacco Use  . Smoking status: Never Smoker  . Smokeless tobacco: Never Used  Substance and Sexual Activity  . Alcohol use: Yes    Comment: occasional - wine on holidays  . Drug use: No  . Sexual activity: Yes    Partners: Female  Lifestyle  . Physical activity    Days per week: Not on file    Minutes per session: Not on file  . Stress: Not on file  Relationships  . Social Musicianconnections    Talks on phone: Not on file    Gets together: Not on file    Attends religious service: Not on file    Active member of club or organization: Not on file    Attends meetings of clubs  or organizations: Not on file    Relationship status: Not on file  . Intimate partner violence    Fear of current or ex partner: Not on file    Emotionally abused: Not on file    Physically abused: Not on file    Forced sexual activity: Not on file  Other Topics Concern  . Not on file  Social History Narrative   Next caffeine. No regular exercise. He does plan on joining a gym. His wife is diabetic.    Outpatient Medications Prior to Visit  Medication Sig Dispense Refill  . omeprazole (PRILOSEC) 20 MG capsule     . atorvastatin (LIPITOR) 40 MG tablet Take 1 tablet (40 mg total) by mouth daily. Please cancel rx for pravastatin 90 tablet 3  . Fluticasone-Salmeterol (ADVAIR) 250-50 MCG/DOSE AEPB Inhale 1 puff into the lungs 2 (two) times a day. 60 each 5  . losartan (COZAAR) 50 MG tablet Take 1 tablet (50 mg total) by mouth daily. No more refills until appt is made. Pt aware. 90 tablet 1  . pravastatin (PRAVACHOL) 40 MG tablet Take 1 tablet (40 mg total) by mouth daily. No more refills until appt is made. Pt  aware. 90 tablet 3  . amoxicillin-clavulanate (AUGMENTIN) 875-125 MG tablet Take 1 tablet by mouth 2 (two) times daily. 20 tablet 0  . albuterol (PROVENTIL) (2.5 MG/3ML) 0.083% nebulizer solution 2.5 mg      No facility-administered medications prior to visit.     Allergies  Allergen Reactions  . Phentermine Other (See Comments)    Nausea  . Prednisone Rash  . Sulfa Antibiotics Rash  . Tamsulosin Rash  . Tramadol Rash    ROS     Objective:    Physical Exam  Constitutional: He is oriented to person, place, and time. He appears well-developed and well-nourished.  HENT:  Head: Normocephalic and atraumatic.  Right Ear: External ear normal.  Left Ear: External ear normal.  Nose: Nose normal.  Mouth/Throat: Oropharynx is clear and moist.  TMs and canals are clear.   Eyes: Pupils are equal, round, and reactive to light. Conjunctivae and EOM are normal.  Neck: Neck supple. No thyromegaly present.  Cardiovascular: Normal rate and normal heart sounds.  Pulmonary/Chest: Effort normal and breath sounds normal.  Lymphadenopathy:    He has no cervical adenopathy.  Neurological: He is alert and oriented to person, place, and time.  Skin: Skin is warm and dry.  Psychiatric: He has a normal mood and affect.    BP (!) 144/90   Pulse 78   Ht 5\' 6"  (1.676 m)   Wt 240 lb (108.9 kg)   SpO2 97%   BMI 38.74 kg/m  Wt Readings from Last 3 Encounters:  05/07/19 240 lb (108.9 kg)  03/11/18 233 lb (105.7 kg)  02/18/18 234 lb (106.1 kg)    There are no preventive care reminders to display for this patient.  There are no preventive care reminders to display for this patient.   Lab Results  Component Value Date   TSH 2.11 04/23/2016   Lab Results  Component Value Date   WBC 4.3 03/02/2019   HGB 15.7 03/02/2019   HCT 45.8 03/02/2019   MCV 90.9 03/02/2019   PLT 224 03/02/2019   Lab Results  Component Value Date   NA 140 03/02/2019   K 4.1 03/02/2019   CO2 30 03/02/2019    GLUCOSE 109 (H) 03/02/2019   BUN 18 03/02/2019   CREATININE 0.87 03/02/2019  BILITOT 1.0 03/02/2019   ALKPHOS 47 04/11/2017   AST 28 03/02/2019   ALT 40 03/02/2019   PROT 7.6 03/02/2019   ALBUMIN 4.6 04/11/2017   CALCIUM 9.8 03/02/2019   Lab Results  Component Value Date   CHOL 231 (H) 03/02/2019   Lab Results  Component Value Date   HDL 65 03/02/2019   Lab Results  Component Value Date   LDLCALC 141 (H) 03/02/2019   Lab Results  Component Value Date   TRIG 126 03/02/2019   Lab Results  Component Value Date   CHOLHDL 3.6 03/02/2019   Lab Results  Component Value Date   HGBA1C 5.7 (H) 03/02/2019       Assessment & Plan:   Problem List Items Addressed This Visit    None    Visit Diagnoses    Sensation of fullness in both ears    -  Primary   Sinus drainage       Sneezing         Fullness in ears bilaterally-ear exam itself was completely normal and tympanometry was normal today in both ears indicating no significant alteration in pressure.  I think some of this sensation is probably coming from the sinuses since he also has chronic nasal congestion.  Sinus drainage-organ to try Atrovent since the Flonase and antihistamines were not helpful.  I discussed possibly sending him to an allergist for full work-up determine if his symptoms are truly coming from allergies or not but right now he says he just cannot afford it and would rather try some new medications first to see if they are helpful.  We will also try Singulair as he has not used that before.  I do think he would benefit from nasal saline irrigation and or Nettie pot.  Sneezing-still unclear if allergy related or not.  Hypertension-blood pressure is still elevated today.  Though he has gained some weight since he was last here.  Did encourage him to work on weight loss and could consider increasing his losartan to 100 mg.  Meds ordered this encounter  Medications  . ipratropium (ATROVENT) 0.06 % nasal  spray    Sig: Place 2 sprays into both nostrils 3 (three) times daily.    Dispense:  15 mL    Refill:  3  . montelukast (SINGULAIR) 10 MG tablet    Sig: Take 1 tablet (10 mg total) by mouth at bedtime.    Dispense:  30 tablet    Refill:  3     Nani Gasseratherine Metheney, MD

## 2019-05-20 ENCOUNTER — Telehealth (INDEPENDENT_AMBULATORY_CARE_PROVIDER_SITE_OTHER): Payer: BC Managed Care – PPO

## 2019-05-20 DIAGNOSIS — R0981 Nasal congestion: Secondary | ICD-10-CM | POA: Diagnosis not present

## 2019-05-20 MED ORDER — AZELASTINE-FLUTICASONE 137-50 MCG/ACT NA SUSP: NASAL | 0 refills | Status: DC

## 2019-05-20 NOTE — Telephone Encounter (Signed)
Can try Dymista nasal spray but if this isn't helpful h eneeds follow up with PCP / needs to see allergist as discussed   5 mins spent

## 2019-05-20 NOTE — Telephone Encounter (Signed)
Lance Hernandez called and states he still has a runny nose, right ear fullness and sneezing. He states he can't afford to go to a ENT. He would like recommendations of any additional medications he can take. Please advise.

## 2019-05-21 MED ORDER — QNASL 80 MCG/ACT NA AERS: 2.0000 | INHALATION_SPRAY | Freq: Every day | NASAL | 0 refills | Status: DC

## 2019-05-21 NOTE — Telephone Encounter (Signed)
That medication is really not much different from the [facility] but we can try it.

## 2019-05-21 NOTE — Telephone Encounter (Signed)
Lance Hernandez states the Lance Hernandez is to much with his insurance. He states the pharmacist states Lance Hernandez would be covered.

## 2019-05-21 NOTE — Addendum Note (Signed)
Addended by: Maryla Morrow on: 05/21/2019 08:59 AM   Modules accepted: Orders

## 2019-06-22 ENCOUNTER — Other Ambulatory Visit: Payer: Self-pay | Admitting: Family Medicine

## 2019-07-27 DIAGNOSIS — J342 Deviated nasal septum: Secondary | ICD-10-CM | POA: Insufficient documentation

## 2019-07-27 DIAGNOSIS — J343 Hypertrophy of nasal turbinates: Secondary | ICD-10-CM | POA: Insufficient documentation

## 2019-07-27 DIAGNOSIS — R0981 Nasal congestion: Secondary | ICD-10-CM | POA: Diagnosis not present

## 2019-07-27 DIAGNOSIS — J328 Other chronic sinusitis: Secondary | ICD-10-CM | POA: Diagnosis not present

## 2019-07-27 DIAGNOSIS — J329 Chronic sinusitis, unspecified: Secondary | ICD-10-CM | POA: Diagnosis not present

## 2019-07-27 DIAGNOSIS — K089 Disorder of teeth and supporting structures, unspecified: Secondary | ICD-10-CM | POA: Diagnosis not present

## 2019-07-27 DIAGNOSIS — K056 Periodontal disease, unspecified: Secondary | ICD-10-CM | POA: Diagnosis not present

## 2019-08-31 ENCOUNTER — Telehealth: Payer: Self-pay

## 2019-08-31 NOTE — Telephone Encounter (Signed)
Morio called and complains of bilateral fullness in ears. He has had this for over 3.5 months. He has seen the ENT and Dentist without relief. He has tried a nettie pot and Flonase without relief. He states he wants to try different solutions until we find something that will help. He states he is not going to go to another specialist. Mostly because it cost too much money. He is tired of spending so much money on health care. Denies any other symptoms. The dentist suggested it was a sinus problem.

## 2019-09-02 NOTE — Telephone Encounter (Signed)
Looks like he has tried several things including over-the-counter allergy medicines, Singulair, Atrovent nasal spray, Augmentin, oral steroids, Nettie pot, Flonase.  Choices are getting more narrow.  Can we please find out where he went for ENT and see if we can get a copy of their notes.  See if they did any imaging.  At this point the next step would be to get a CT of the sinuses to see what may be going on.  There may be things like polyps which could be causing obstruction and chronic congestion and ear fullness.

## 2019-09-02 NOTE — Telephone Encounter (Signed)
The ENT did suggest having a CT. However, he states he is not going to do a CT and this is the reason health care/insurane cost so much. He feels like he has spent enough money. He only wants simple solutions/treatments.

## 2019-09-03 ENCOUNTER — Ambulatory Visit (INDEPENDENT_AMBULATORY_CARE_PROVIDER_SITE_OTHER): Payer: BC Managed Care – PPO | Admitting: Family Medicine

## 2019-09-03 ENCOUNTER — Encounter: Payer: Self-pay | Admitting: Family Medicine

## 2019-09-03 ENCOUNTER — Other Ambulatory Visit: Payer: Self-pay

## 2019-09-03 VITALS — BP 170/99 | HR 97 | Ht 66.0 in | Wt 242.0 lb

## 2019-09-03 DIAGNOSIS — M545 Low back pain, unspecified: Secondary | ICD-10-CM

## 2019-09-03 DIAGNOSIS — I10 Essential (primary) hypertension: Secondary | ICD-10-CM | POA: Diagnosis not present

## 2019-09-03 DIAGNOSIS — N401 Enlarged prostate with lower urinary tract symptoms: Secondary | ICD-10-CM | POA: Diagnosis not present

## 2019-09-03 DIAGNOSIS — Z6841 Body Mass Index (BMI) 40.0 and over, adult: Secondary | ICD-10-CM

## 2019-09-03 DIAGNOSIS — R3912 Poor urinary stream: Secondary | ICD-10-CM

## 2019-09-03 LAB — POCT URINALYSIS DIP (CLINITEK)
Bilirubin, UA: NEGATIVE
Glucose, UA: NEGATIVE mg/dL
Leukocytes, UA: NEGATIVE
Nitrite, UA: NEGATIVE
POC PROTEIN,UA: NEGATIVE
Spec Grav, UA: 1.025 (ref 1.010–1.025)
Urobilinogen, UA: 0.2 E.U./dL
pH, UA: 5.5 (ref 5.0–8.0)

## 2019-09-03 MED ORDER — LOSARTAN POTASSIUM 100 MG PO TABS: 100.0000 mg | ORAL_TABLET | Freq: Every day | ORAL | 1 refills | Status: DC

## 2019-09-03 MED ORDER — FINASTERIDE 5 MG PO TABS: 5.0000 mg | ORAL_TABLET | Freq: Every day | ORAL | 0 refills | Status: DC

## 2019-09-03 MED ORDER — TRAMADOL HCL 50 MG PO TABS: 50.0000 mg | ORAL_TABLET | Freq: Three times a day (TID) | ORAL | 0 refills | Status: AC | PRN

## 2019-09-03 NOTE — Telephone Encounter (Signed)
Unfortunately, I do not really have anything else to offer him.  He has tried everything from antihistamines to leukotriene inhibitors to nasal steroid sprays to oral antibiotics etc.  At this point I do not know what else to give him that I feel would actually be helpful.  So I am very sorry I know this is disappointing and I know he is very frustrated and I do understand.  Please see if he is received a flu shot this year.

## 2019-09-03 NOTE — Progress Notes (Signed)
Acute Office Visit  Subjective:    Patient ID: Lance Hernandez, male    DOB: 11/15/1955, 63 y.o.   MRN: [id number]  Chief Complaint  Patient presents with  . Back Pain    HPI Patient is in today for right sided pain.  That is actually started this morning.  He says it was about a 7 out of 10.  He does have a history of kidney stones of the last time he passed a kidney stone was probably a little over 20 years ago.  He says he took 2 Tylenol this morning and that really has helped ease off his pain.  He did feel sweaty when the pain occurred.  He denies any major changes in bowel movements.  And blood in the urine or stool.  No fevers, sweats, or chills.  He is not had any pain with urination.  Though he says that he is noticed that it is been a little bit more difficult to urinate over the last 3 to 4 days.  Though he has had decreased strength in urinary stream for several years.  He admits he probably is not drinking quite enough fluid.  No pelvic pain   Past Medical History:  Diagnosis Date  . Colitis   . DJD (degenerative joint disease)   . First degree AV block   . History of prostatitis   . Hyperlipidemia   . Hypertension   . Scoliosis     Past Surgical History:  Procedure Laterality Date  . TONSILLECTOMY      Family History  Problem Relation Age of Onset  . COPD Mother        Smoker  . COPD Sister        Smoker    Social History   Socioeconomic History  . Marital status: Married    Spouse name: susan   . Number of children: 1  . Years of education: Not on file  . Highest education level: Not on file  Occupational History  . Occupation: Airline pilotales    Comment: Computer work  Engineer, productionocial Needs  . Financial resource strain: Not on file  . Food insecurity    Worry: Not on file    Inability: Not on file  . Transportation needs    Medical: Not on file    Non-medical: Not on file  Tobacco Use  . Smoking status: Never Smoker  . Smokeless tobacco: Never Used  Substance and  Sexual Activity  . Alcohol use: Yes    Comment: occasional - wine on holidays  . Drug use: No  . Sexual activity: Yes    Partners: Female  Lifestyle  . Physical activity    Days per week: Not on file    Minutes per session: Not on file  . Stress: Not on file  Relationships  . Social Musicianconnections    Talks on phone: Not on file    Gets together: Not on file    Attends religious service: Not on file    Active member of club or organization: Not on file    Attends meetings of clubs or organizations: Not on file    Relationship status: Not on file  . Intimate partner violence    Fear of current or ex partner: Not on file    Emotionally abused: Not on file    Physically abused: Not on file    Forced sexual activity: Not on file  Other Topics Concern  . Not on file  Social History  Narrative   Next caffeine. No regular exercise. He does plan on joining a gym. His wife is diabetic.    Outpatient Medications Prior to Visit  Medication Sig Dispense Refill  . atorvastatin (LIPITOR) 40 MG tablet Take 1 tablet (40 mg total) by mouth daily. Please cancel rx for pravastatin 90 tablet 3  . Beclomethasone Dipropionate (QNASL) 80 MCG/ACT AERS Place 2 sprays into both nostrils daily. 10.6 g 0  . Fluticasone-Salmeterol (ADVAIR) 250-50 MCG/DOSE AEPB Inhale 1 puff into the lungs 2 (two) times a day. 60 each 5  . ipratropium (ATROVENT) 0.06 % nasal spray Place 2 sprays into both nostrils 3 (three) times daily. 15 mL 3  . omeprazole (PRILOSEC) 20 MG capsule TAKE 1 CAPSULE DAILY 90 capsule 3  . pravastatin (PRAVACHOL) 40 MG tablet Take 1 tablet (40 mg total) by mouth daily. No more refills until appt is made. Pt aware. 90 tablet 3  . losartan (COZAAR) 50 MG tablet Take 1 tablet (50 mg total) by mouth daily. No more refills until appt is made. Pt aware. 90 tablet 1  . montelukast (SINGULAIR) 10 MG tablet Take 1 tablet (10 mg total) by mouth at bedtime. 30 tablet 3   No facility-administered medications  prior to visit.     Allergies  Allergen Reactions  . Phentermine Other (See Comments)    Nausea  . Prednisone Rash  . Sulfa Antibiotics Rash  . Tamsulosin Rash  . Tramadol Rash    ROS     Objective:    Physical Exam  Constitutional: He is oriented to person, place, and time. He appears well-developed and well-nourished.  HENT:  Head: Normocephalic and atraumatic.  Eyes: Conjunctivae and EOM are normal.  Cardiovascular: Normal rate.  Pulmonary/Chest: Effort normal.  Abdominal: Soft. Bowel sounds are normal. He exhibits no distension and no mass. There is no abdominal tenderness. There is no rebound and no guarding.  Nontender over his right side over the ribs or just below.  Nontender abdomen.  Musculoskeletal:     Comments: No CVA tenderness.  Neurological: He is alert and oriented to person, place, and time.  Skin: Skin is dry. No pallor.  Psychiatric: He has a normal mood and affect. His behavior is normal.  Vitals reviewed.   BP (!) 170/99   Pulse 97   Ht 5\' 6"  (1.676 m)   Wt 242 lb (109.8 kg)   SpO2 96%   BMI 39.06 kg/m  Wt Readings from Last 3 Encounters:  09/03/19 242 lb (109.8 kg)  05/07/19 240 lb (108.9 kg)  03/11/18 233 lb (105.7 kg)    Health Maintenance Due  Topic Date Due  . INFLUENZA VACCINE  05/15/2019    There are no preventive care reminders to display for this patient.   Lab Results  Component Value Date   TSH 2.11 04/23/2016   Lab Results  Component Value Date   WBC 4.3 03/02/2019   HGB 15.7 03/02/2019   HCT 45.8 03/02/2019   MCV 90.9 03/02/2019   PLT 224 03/02/2019   Lab Results  Component Value Date   NA 140 03/02/2019   K 4.1 03/02/2019   CO2 30 03/02/2019   GLUCOSE 109 (H) 03/02/2019   BUN 18 03/02/2019   CREATININE 0.87 03/02/2019   BILITOT 1.0 03/02/2019   ALKPHOS 47 04/11/2017   AST 28 03/02/2019   ALT 40 03/02/2019   PROT 7.6 03/02/2019   ALBUMIN 4.6 04/11/2017   CALCIUM 9.8 03/02/2019   Lab Results  Component Value Date   CHOL 231 (H) 03/02/2019   Lab Results  Component Value Date   HDL 65 03/02/2019   Lab Results  Component Value Date   LDLCALC 141 (H) 03/02/2019   Lab Results  Component Value Date   TRIG 126 03/02/2019   Lab Results  Component Value Date   CHOLHDL 3.6 03/02/2019   Lab Results  Component Value Date   HGBA1C 5.7 (H) 03/02/2019       Assessment & Plan:   Problem List Items Addressed This Visit      Cardiovascular and Mediastinum   Essential hypertension, benign    Pressure not well controlled today.  Will increase losartan to 100 mg.  New prescription sent to mail order pharmacy.  Keep appointment follow-up in 4 months.      Relevant Medications   losartan (COZAAR) 100 MG tablet     Genitourinary   BPH (benign prostatic hyperplasia)    It looks like based on her allergy list he actually developed a rash with Flomax so I am hesitant to use anything in that class.  So recommend start finasteride though I did explain that it can take quite a while to work.  But I do think we need to address his BPH symptoms and his weak urinary stream.      Relevant Medications   finasteride (PROSCAR) 5 MG tablet   losartan (COZAAR) 100 MG tablet    Other Visit Diagnoses    Right-sided low back pain without sciatica, unspecified chronicity    -  Primary   Relevant Medications   traMADol (ULTRAM) 50 MG tablet   Other Relevant Orders   POCT URINALYSIS DIP (CLINITEK) (Completed)   BMI 40.0-44.9, adult (HCC)         Right flank pain/side pain-urinalysis was positive for blood but negative for infection.  Consider it could be kidney stones right now he is not in a lot of discomfort.  Okay to continue to use Tylenol as needed I did give him a prescription of tramadol to use over the weekend if the pain becomes more severe.  He can always seek emergency medical care or give [place] a call after the weekend if it worsens or persists and we need to do imaging.  Right now  he is nontender on exam.  We discussed increasing fluid intake to at least 80 ounces a day.  He says most days he only drinks about 20 ounces of fluid total.  BMI 41 -encouraged him to work on cutting back on portion sizes as well as decreasing his carb intake.  He says he is a bread- aholic.  Meds ordered this encounter  Medications  . finasteride (PROSCAR) 5 MG tablet    Sig: Take 1 tablet (5 mg total) by mouth daily.    Dispense:  90 tablet    Refill:  0  . traMADol (ULTRAM) 50 MG tablet    Sig: Take 1-2 tablets (50-100 mg total) by mouth every 8 (eight) hours as needed for up to 5 days.    Dispense:  30 tablet    Refill:  0  . losartan (COZAAR) 100 MG tablet    Sig: Take 1 tablet (100 mg total) by mouth daily. No more refills until appt is made. Pt aware.    Dispense:  90 tablet    Refill:  1     Nani Gasser, MD

## 2019-09-03 NOTE — Assessment & Plan Note (Signed)
It looks like based on her allergy list he actually developed a rash with Flomax so I am hesitant to use anything in that class.  So recommend start finasteride though I did explain that it can take quite a while to work.  But I do think we need to address his BPH symptoms and his weak urinary stream.

## 2019-09-03 NOTE — Telephone Encounter (Signed)
Patient advised.

## 2019-09-03 NOTE — Assessment & Plan Note (Signed)
Pressure not well controlled today.  Will increase losartan to 100 mg.  New prescription sent to mail order pharmacy.  Keep appointment follow-up in 4 months.

## 2019-09-04 ENCOUNTER — Other Ambulatory Visit: Payer: Self-pay | Admitting: Family Medicine

## 2019-09-06 ENCOUNTER — Telehealth: Payer: Self-pay | Admitting: Family Medicine

## 2019-09-06 DIAGNOSIS — M545 Low back pain, unspecified: Secondary | ICD-10-CM

## 2019-09-06 MED ORDER — HYDROCODONE-ACETAMINOPHEN 5-325 MG PO TABS: 1.0000 | ORAL_TABLET | Freq: Four times a day (QID) | ORAL | 0 refills | Status: DC | PRN

## 2019-09-06 NOTE — Telephone Encounter (Signed)
Patient is aware and he did not have any questions.

## 2019-09-06 NOTE — Telephone Encounter (Signed)
Patient called and reports that Tramadol was sent to the pharmacy on Friday. He reports that he gets a rash when he takes the medicine. Can you call something else into the pharmacy? I have called Marcie Bal at [facility] and she will cancel the Tramadol for the patient.  Please advise.

## 2019-09-06 NOTE — Addendum Note (Signed)
Addended by: Narda Rutherford on: 09/06/2019 03:02 PM   Modules accepted: Orders

## 2019-09-06 NOTE — Telephone Encounter (Signed)
OK, New rx sent.

## 2019-09-22 DIAGNOSIS — J328 Other chronic sinusitis: Secondary | ICD-10-CM | POA: Diagnosis not present

## 2019-09-23 DIAGNOSIS — J449 Chronic obstructive pulmonary disease, unspecified: Secondary | ICD-10-CM | POA: Diagnosis not present

## 2019-09-23 DIAGNOSIS — J343 Hypertrophy of nasal turbinates: Secondary | ICD-10-CM | POA: Diagnosis not present

## 2019-09-23 DIAGNOSIS — J342 Deviated nasal septum: Secondary | ICD-10-CM | POA: Diagnosis not present

## 2019-09-23 DIAGNOSIS — J328 Other chronic sinusitis: Secondary | ICD-10-CM | POA: Diagnosis not present

## 2019-09-30 ENCOUNTER — Other Ambulatory Visit: Payer: Self-pay | Admitting: *Deleted

## 2019-12-03 ENCOUNTER — Other Ambulatory Visit: Payer: Self-pay | Admitting: *Deleted

## 2019-12-03 MED ORDER — FINASTERIDE 5 MG PO TABS: 5.0000 mg | ORAL_TABLET | Freq: Every day | ORAL | 0 refills | Status: DC

## 2019-12-06 ENCOUNTER — Other Ambulatory Visit: Payer: Self-pay

## 2019-12-06 DIAGNOSIS — N401 Enlarged prostate with lower urinary tract symptoms: Secondary | ICD-10-CM

## 2019-12-06 MED ORDER — LOSARTAN POTASSIUM 100 MG PO TABS: 100.0000 mg | ORAL_TABLET | Freq: Every day | ORAL | 1 refills | Status: DC

## 2019-12-06 MED ORDER — OMEPRAZOLE 20 MG PO CPDR: 20.0000 mg | DELAYED_RELEASE_CAPSULE | Freq: Every day | ORAL | 3 refills | Status: DC

## 2019-12-06 MED ORDER — IPRATROPIUM BROMIDE 0.06 % NA SOLN: 2.0000 | Freq: Three times a day (TID) | NASAL | 1 refills | Status: DC

## 2019-12-06 MED ORDER — ATORVASTATIN CALCIUM 40 MG PO TABS: 40.0000 mg | ORAL_TABLET | Freq: Every day | ORAL | 3 refills | Status: DC

## 2019-12-06 MED ORDER — PRAVASTATIN SODIUM 40 MG PO TABS: 40.0000 mg | ORAL_TABLET | Freq: Every day | ORAL | 3 refills | Status: DC

## 2019-12-06 MED ORDER — FINASTERIDE 5 MG PO TABS: 5.0000 mg | ORAL_TABLET | Freq: Every day | ORAL | 0 refills | Status: DC

## 2019-12-06 MED ORDER — FLUTICASONE-SALMETEROL 250-50 MCG/DOSE IN AEPB: 1.0000 | INHALATION_SPRAY | Freq: Two times a day (BID) | RESPIRATORY_TRACT | 1 refills | Status: DC

## 2019-12-31 ENCOUNTER — Ambulatory Visit: Payer: BC Managed Care – PPO | Attending: Internal Medicine

## 2019-12-31 ENCOUNTER — Ambulatory Visit: Payer: BC Managed Care – PPO

## 2019-12-31 DIAGNOSIS — Z23 Encounter for immunization: Secondary | ICD-10-CM

## 2019-12-31 NOTE — Progress Notes (Signed)
   Covid-19 Vaccination Clinic  Name:  Lance Hernandez    MRN: [id number] DOB: 17-Dec-1955  12/31/2019  Lance Hernandez was observed post Covid-19 immunization for 15 minutes without incident. He was provided with Vaccine Information Sheet and instruction to access the V-Safe system.   Lance Hernandez was instructed to call 911 with any severe reactions post vaccine: Marland Kitchen Difficulty breathing  . Swelling of face and throat  . A fast heartbeat  . A bad rash all over body  . Dizziness and weakness   Immunizations Administered    Name Date Dose VIS Date Route   Pfizer COVID-19 Vaccine 12/31/2019  8:33 AM 0.3 mL 09/24/2019 Intramuscular   Manufacturer: [employer], [employer]   Lot: [id number]   NDC: [address]-1

## 2020-01-24 ENCOUNTER — Other Ambulatory Visit: Payer: Self-pay | Admitting: *Deleted

## 2020-01-24 MED ORDER — FINASTERIDE 5 MG PO TABS: 5.0000 mg | ORAL_TABLET | Freq: Every day | ORAL | 0 refills | Status: DC

## 2020-01-26 ENCOUNTER — Ambulatory Visit: Payer: BC Managed Care – PPO | Attending: Internal Medicine

## 2020-01-26 DIAGNOSIS — Z23 Encounter for immunization: Secondary | ICD-10-CM

## 2020-01-26 NOTE — Progress Notes (Signed)
   Covid-19 Vaccination Clinic  Name:  Lance Hernandez    MRN: [id number] DOB: December 28, 1955  01/26/2020  [name] was observed post Covid-19 immunization for 15 minutes without incident. He was provided with Vaccine Information Sheet and instruction to access the V-Safe system.   [name] was instructed to call 911 with any severe reactions post vaccine: Marland Kitchen Difficulty breathing  . Swelling of face and throat  . A fast heartbeat  . A bad rash all over body  . Dizziness and weakness   Immunizations Administered    Name Date Dose VIS Date Route   Pfizer COVID-19 Vaccine 01/26/2020  8:19 AM 0.3 mL 09/24/2019 Intramuscular   Manufacturer: [employer], [employer]   Lot: [id number]   NDC: [address]-6

## 2020-02-14 ENCOUNTER — Telehealth: Payer: Self-pay

## 2020-02-14 NOTE — Telephone Encounter (Signed)
Lance Hernandez states he doesn't want the Advair 250-50 to be filled in the future. He states the cost is too much and it really isn't helping. He did states he takes the medication every now and then if he feels he needs to take.

## 2020-02-14 NOTE — Telephone Encounter (Signed)
Patient advised.

## 2020-02-14 NOTE — Telephone Encounter (Signed)
K to remove from medication list so that it does not accidentally get refilled he may also want to call his pharmacy and make sure that they take him off of any type of artery refill for that particular drug.

## 2020-05-05 ENCOUNTER — Other Ambulatory Visit: Payer: Self-pay | Admitting: Family Medicine

## 2020-05-30 ENCOUNTER — Other Ambulatory Visit: Payer: Self-pay | Admitting: Family Medicine

## 2020-05-30 DIAGNOSIS — N401 Enlarged prostate with lower urinary tract symptoms: Secondary | ICD-10-CM

## 2020-06-05 ENCOUNTER — Telehealth: Payer: Self-pay

## 2020-06-05 NOTE — Telephone Encounter (Signed)
Lance Hernandez states he has ringing in his ears. Reports no other symptoms. He states he has an appointment in a couple of weeks and would rather not come in. He is requesting recommendations for what he could do for the ringing in his ears. Please advise.

## 2020-06-05 NOTE — Telephone Encounter (Signed)
Unfortunately, there is no specific treatment 98% of the time for ear ringing.  There are only a few reversible causes.  He is welcome to discuss at his next office visit if it persists.

## 2020-06-06 NOTE — Telephone Encounter (Signed)
Left message advising of recommendations.  

## 2020-06-06 NOTE — Telephone Encounter (Signed)
Patient advised of recommendations.  

## 2020-06-14 ENCOUNTER — Ambulatory Visit (INDEPENDENT_AMBULATORY_CARE_PROVIDER_SITE_OTHER): Payer: BC Managed Care – PPO | Admitting: Medical-Surgical

## 2020-06-14 ENCOUNTER — Telehealth: Payer: Self-pay

## 2020-06-14 ENCOUNTER — Encounter: Payer: Self-pay | Admitting: Medical-Surgical

## 2020-06-14 VITALS — BP 171/102 | HR 75 | Temp 98.6°F | Ht 66.0 in | Wt 230.5 lb

## 2020-06-14 DIAGNOSIS — F5105 Insomnia due to other mental disorder: Secondary | ICD-10-CM | POA: Diagnosis not present

## 2020-06-14 DIAGNOSIS — H9311 Tinnitus, right ear: Secondary | ICD-10-CM | POA: Diagnosis not present

## 2020-06-14 DIAGNOSIS — F419 Anxiety disorder, unspecified: Secondary | ICD-10-CM

## 2020-06-14 DIAGNOSIS — I10 Essential (primary) hypertension: Secondary | ICD-10-CM

## 2020-06-14 DIAGNOSIS — F99 Mental disorder, not otherwise specified: Secondary | ICD-10-CM

## 2020-06-14 MED ORDER — ONE-A-DAY MENS PO TABS: 1.0000 | ORAL_TABLET | Freq: Every day | ORAL | 3 refills | Status: AC

## 2020-06-14 MED ORDER — ESCITALOPRAM OXALATE 10 MG PO TABS: ORAL_TABLET | ORAL | 1 refills | Status: DC

## 2020-06-14 MED ORDER — HYDROXYZINE PAMOATE 50 MG PO CAPS: ORAL_CAPSULE | ORAL | 0 refills | Status: DC

## 2020-06-14 MED ORDER — ONDANSETRON 8 MG PO TBDP: 8.0000 mg | ORAL_TABLET | Freq: Three times a day (TID) | ORAL | 0 refills | Status: DC | PRN

## 2020-06-14 NOTE — Telephone Encounter (Signed)
Use of dictation software has a risk of errors that may not be caught until later. The note has been corrected to reflect his hearing loss.

## 2020-06-14 NOTE — Progress Notes (Addendum)
Subjective:    CC: anxiety, insomnia, HTN, tinnitus  HPI: Very anxious 64 year old male presenting to discuss several concerns today:  Anxiety- reports significant anxiety his whole life and describes himself as a worrying type of person who worries about worrying. Over the past 2 weeks, his symptoms have gotten worse and he is having episodes of claustrophobia and symptoms of panic attack (feeling like he can't catch his breath, very anxious, restless, etc). Feels that the ringing in his ear is making his anxiety worse.   Insomnia- has been having trouble sleeping at night. Lays down for a few minutes but gets right back up due to anxiety and restlessness. Only gets 1-2 hours of sleep at night.  HTN- has been elevated lately. Taking Losartan 100mg  daily. Suspects his anxiety and no sleep is making it worse.  Tinnitus- ear ringing in the right ear continuously for the past 2 weeks accompanied by hearing loss. Has an appointment with ENT tomorrow for further evaluation.  I reviewed the past medical history, family history, social history, surgical history, and allergies today and no changes were needed.  Please see the problem list section below in epic for further details.  Past Medical History: Past Medical History:  Diagnosis Date  . Colitis   . DJD (degenerative joint disease)   . First degree AV block   . History of prostatitis   . Hyperlipidemia   . Hypertension   . Scoliosis    Past Surgical History: Past Surgical History:  Procedure Laterality Date  . TONSILLECTOMY     Social History: Social History   Socioeconomic History  . Marital status: Married    Spouse name: susan   . Number of children: 1  . Years of education: Not on file  . Highest education level: Not on file  Occupational History  . Occupation:    Comment: Computer work  Tobacco Use  . Smoking status: Never Smoker  . Smokeless tobacco: Never Used  Substance and Sexual Activity  . Alcohol  use: Yes    Comment: occasional - wine on holidays  . Drug use: No  . Sexual activity: Yes    Partners: Female  Other Topics Concern  . Not on file  Social History Narrative   Next caffeine. No regular exercise. He does plan on joining a gym. His wife is diabetic.   Social Determinants of Health   Financial Resource Strain:   . Difficulty of Paying Living Expenses: Not on file  Food Insecurity:   . Worried About Airline pilot in the Last Year: Not on file  . Ran Out of Food in the Last Year: Not on file  Transportation Needs:   . Lack of Transportation (Medical): Not on file  . Lack of Transportation (Non-Medical): Not on file  Physical Activity:   . Days of Exercise per Week: Not on file  . Minutes of Exercise per Session: Not on file  Stress:   . Feeling of Stress : Not on file  Social Connections:   . Frequency of Communication with Friends and Family: Not on file  . Frequency of Social Gatherings with Friends and Family: Not on file  . Attends Religious Services: Not on file  . Active Member of Clubs or Organizations: Not on file  . Attends Programme researcher, broadcasting/film/video Meetings: Not on file  . Marital Status: Not on file   Family History: Family History  Problem Relation Age of Onset  . COPD Mother  Smoker  . COPD Sister        Smoker   Allergies: Allergies  Allergen Reactions  . Phentermine Other (See Comments)    Nausea  . Prednisone Rash  . Sulfa Antibiotics Rash  . Tamsulosin Rash  . Tramadol Rash   Medications: See med rec.  Review of Systems: See HPI for pertinent positives and negatives.   Objective:    General: Well Developed, well nourished, and in no acute distress. Anxious, restless, and agitated during appointment.  Neuro: Alert and oriented x3.  HEENT: Normocephalic, atraumatic.  Skin: Warm and dry. Cardiac: Regular rate and rhythm, no murmurs rubs or gallops, no lower extremity edema.  Respiratory: Clear to auscultation bilaterally.  Not using accessory muscles, speaking in full sentences.  Impression and Recommendations:    1. Essential hypertension, benign Suspect BP is elevated by severe anxiety as well as lack of sleep. Will attempt to reduce anxiety and get some sleep with measures below. Continue Losartan 100mg  daily for now. Advised to continue to monitor BP at home and to keep scheduled appointment with Dr. on 9/7.  2. Anxiety Discussed maintenance medications vs prn anxiety meds. Also discussed the role of counseling in managing anxiety. After discussing options, patient is willing to trial a medication for maintenance. Starting Lexapro with 1 week of 5mg  daily then increase to 10mg  daily. Reviewed expectations for medication efficacy. Also, sending Hydroxyzine 25-50mg  TID as needed for anxiety but recommend using at night only for now.  3. Tinnitus of right ear Follow up with ENT as planned on 9/2.  4. Insomnia due to other mental disorder Hydroxyzine 25-50mg  as needed 37min-1hour before bedtime for sleep/anxiety.   Return for as scheduled for appointment with Dr. . ___________________________________________ 11/2, DNP, APRN, FNP-BC Primary Care and Sports Medicine [facility] [place]

## 2020-06-14 NOTE — Telephone Encounter (Signed)
Pt called and stated that he reviewed the progress note from his OV this morning and there is an error. He states that where is days "tiniitus without hearing loss" that is not correct. He states he does have hearing loss. He would like to have this corrected in his chart.

## 2020-06-15 ENCOUNTER — Other Ambulatory Visit: Payer: Self-pay

## 2020-06-15 DIAGNOSIS — Z79899 Other long term (current) drug therapy: Secondary | ICD-10-CM | POA: Diagnosis not present

## 2020-06-15 DIAGNOSIS — F418 Other specified anxiety disorders: Secondary | ICD-10-CM | POA: Diagnosis not present

## 2020-06-15 DIAGNOSIS — J328 Other chronic sinusitis: Secondary | ICD-10-CM | POA: Diagnosis not present

## 2020-06-15 DIAGNOSIS — H9071 Mixed conductive and sensorineural hearing loss, unilateral, right ear, with unrestricted hearing on the contralateral side: Secondary | ICD-10-CM | POA: Diagnosis not present

## 2020-06-15 DIAGNOSIS — H903 Sensorineural hearing loss, bilateral: Secondary | ICD-10-CM | POA: Insufficient documentation

## 2020-06-15 DIAGNOSIS — J343 Hypertrophy of nasal turbinates: Secondary | ICD-10-CM | POA: Diagnosis not present

## 2020-06-15 DIAGNOSIS — J3489 Other specified disorders of nose and nasal sinuses: Secondary | ICD-10-CM | POA: Insufficient documentation

## 2020-06-15 DIAGNOSIS — H9311 Tinnitus, right ear: Secondary | ICD-10-CM | POA: Insufficient documentation

## 2020-06-15 DIAGNOSIS — K0889 Other specified disorders of teeth and supporting structures: Secondary | ICD-10-CM | POA: Diagnosis not present

## 2020-06-15 DIAGNOSIS — H905 Unspecified sensorineural hearing loss: Secondary | ICD-10-CM | POA: Diagnosis not present

## 2020-06-15 DIAGNOSIS — Z888 Allergy status to other drugs, medicaments and biological substances status: Secondary | ICD-10-CM | POA: Diagnosis not present

## 2020-06-15 DIAGNOSIS — J342 Deviated nasal septum: Secondary | ICD-10-CM | POA: Diagnosis not present

## 2020-06-15 MED ORDER — HYDROXYZINE HCL 50 MG PO TABS: 25.0000 mg | ORAL_TABLET | Freq: Three times a day (TID) | ORAL | 0 refills | Status: DC | PRN

## 2020-06-15 NOTE — Telephone Encounter (Signed)
Pt aware his office note has been corrected.   Pt states that the Rx for hydroxyzine 50 mg was written as capsules and to take 1/2-1 tid prn. He said he went ahead and picked up the Rx but does not know how to take it as 1/2 dose daily. I told him that we would send in a new Rx for tablets instead of capsules. Rx has been tee'd up below and is ready for review and approval/denial  Pt states he is also very nervous about taking these meds because he is understanding that when starting these Rx's that the anxiety will get worse. He states he knows that they can help but he does not want the added anxiety at the start of therapy. Pt stated he did not sleep last night because of this.

## 2020-06-15 NOTE — Telephone Encounter (Signed)
Order signed for hydroxyzine in tablet form. This should be ready at the pharmacy for pickup.   As for his nervousness with taking the medications, we discussed this in detail yesterday. Ultimately, he has to make the choice to take them or not. If he doesn't take them, he will need to discuss his concerns with Dr. Linford Arnold. The medications were prescribed to help him but if he doesn't take them, he should expect his symptoms to continue.

## 2020-06-20 ENCOUNTER — Other Ambulatory Visit: Payer: Self-pay

## 2020-06-20 ENCOUNTER — Other Ambulatory Visit: Payer: Self-pay | Admitting: Medical-Surgical

## 2020-06-20 ENCOUNTER — Ambulatory Visit (INDEPENDENT_AMBULATORY_CARE_PROVIDER_SITE_OTHER): Payer: BC Managed Care – PPO | Admitting: Family Medicine

## 2020-06-20 ENCOUNTER — Encounter: Payer: Self-pay | Admitting: Family Medicine

## 2020-06-20 VITALS — BP 136/79 | HR 73 | Ht 66.0 in | Wt 225.0 lb

## 2020-06-20 DIAGNOSIS — Z0000 Encounter for general adult medical examination without abnormal findings: Secondary | ICD-10-CM | POA: Diagnosis not present

## 2020-06-20 DIAGNOSIS — F419 Anxiety disorder, unspecified: Secondary | ICD-10-CM

## 2020-06-20 NOTE — Progress Notes (Addendum)
Established Patient Office Visit  Subjective:  Patient ID: Lance Hernandez, male    DOB: [birth year]  Age: 64 y.o. MRN: [id number]  CC:  Chief Complaint  Patient presents with  . Annual Exam    HPI Lance Hernandez presents for CPE.   He has had some increased rest and anxiety recently as well as some tinnitus for which she has seen ENT recently.  No regular exercise routine currently, but he has been trying to walk more around the house.  ENT had recommended humidifier, nasal moisturizing etc.  They also reviewed images over the last couple of years.  He has not been sleeping well he has been feeling extremely anxious and was claustrophobic.  He never did start the Lexapro.  He has been taking hydroxyzine and has been getting dry mouth though he says that actually started before starting the medication.  Eye exam is up-to-date.  Performed with [facility]. Does not visit the dentist regularly only if he is having problems.  No recent chest pain or shortness of breath with exercise.  Past Medical History:  Diagnosis Date  . Colitis   . DJD (degenerative joint disease)   . First degree AV block   . History of prostatitis   . Hyperlipidemia   . Hypertension   . Scoliosis     Past Surgical History:  Procedure Laterality Date  . TONSILLECTOMY      Family History  Problem Relation Age of Onset  . COPD Mother        Smoker  . COPD Sister        Smoker    Social History   Socioeconomic History  . Marital status: Married    Spouse name: Lance Hernandez   . Number of children: 1  . Years of education: Not on file  . Highest education level: Not on file  Occupational History  . Occupation: Airline pilot    Comment: Computer work  Tobacco Use  . Smoking status: Never Smoker  . Smokeless tobacco: Never Used  Substance and Sexual Activity  . Alcohol use: Yes    Comment: occasional - wine on holidays  . Drug use: No  . Sexual activity: Yes    Partners: Female  Other Topics Concern  . Not on  file  Social History Narrative   Next caffeine. No regular exercise. He does plan on joining a gym. His wife is diabetic.   Social Determinants of Health   Financial Resource Strain:   . Difficulty of Paying Living Expenses: Not on file  Food Insecurity:   . Worried About Programme researcher, broadcasting/film/video in the Last Year: Not on file  . Ran Out of Food in the Last Year: Not on file  Transportation Needs:   . Lack of Transportation (Medical): Not on file  . Lack of Transportation (Non-Medical): Not on file  Physical Activity:   . Days of Exercise per Week: Not on file  . Minutes of Exercise per Session: Not on file  Stress:   . Feeling of Stress : Not on file  Social Connections:   . Frequency of Communication with Friends and Family: Not on file  . Frequency of Social Gatherings with Friends and Family: Not on file  . Attends Religious Services: Not on file  . Active Member of Clubs or Organizations: Not on file  . Attends Banker Meetings: Not on file  . Marital Status: Not on file  Intimate Partner Violence:   . Fear of Current  or Ex-Partner: Not on file  . Emotionally Abused: Not on file  . Physically Abused: Not on file  . Sexually Abused: Not on file    Outpatient Medications Prior to Visit  Medication Sig Dispense Refill  . azelastine (ASTELIN) 0.1 % nasal spray Place into the nose.    . mupirocin ointment (BACTROBAN) 2 % SMARTSIG:Sparingly Both Nares Twice Daily    . acetaminophen (TYLENOL) 325 MG tablet Take 650 mg by mouth every 6 (six) hours as needed.    Marland Kitchen atorvastatin (LIPITOR) 40 MG tablet Take 1 tablet (40 mg total) by mouth daily. Please cancel rx for pravastatin 90 tablet 3  . finasteride (PROSCAR) 5 MG tablet TAKE 1 TABLET DAILY 90 tablet 0  . fluticasone (FLONASE) 50 MCG/ACT nasal spray Place 2 sprays into both nostrils daily.    . hydrOXYzine (ATARAX/VISTARIL) 50 MG tablet Take 0.5-1 tablets (25-50 mg total) by mouth 3 (three) times daily as needed for  anxiety. 30 tablet 0  . ibuprofen (ADVIL) 200 MG tablet Take 200 mg by mouth every 6 (six) hours as needed.    Marland Kitchen losartan (COZAAR) 100 MG tablet Take 1 tablet (100 mg total) by mouth daily. 90 tablet 0  . melatonin 5 MG TABS Take 5 mg by mouth at bedtime as needed.    . multivitamin (ONE-A-DAY MEN'S) TABS tablet Take 1 tablet by mouth daily. 90 tablet 3  . omeprazole (PRILOSEC) 20 MG capsule Take 1 capsule (20 mg total) by mouth daily. 90 capsule 3  . escitalopram (LEXAPRO) 10 MG tablet Take 1/2 tablet (5mg ) daily for 8 days then increase to 1 tablet (10mg ) daily. 30 tablet 1  . ondansetron (ZOFRAN-ODT) 8 MG disintegrating tablet Take 1 tablet (8 mg total) by mouth every 8 (eight) hours as needed for nausea. 20 tablet 0   No facility-administered medications prior to visit.    Allergies  Allergen Reactions  . Phentermine Other (See Comments)    Nausea  . Prednisone Rash  . Sulfa Antibiotics Rash  . Tamsulosin Rash  . Tramadol Rash    ROS Review of Systems    Objective:    Physical Exam Constitutional:      Appearance: He is well-developed.  HENT:     Head: Normocephalic and atraumatic.     Right Ear: External ear normal.     Left Ear: External ear normal.     Nose: Nose normal.  Eyes:     Conjunctiva/sclera: Conjunctivae normal.     Pupils: Pupils are equal, round, and reactive to light.  Neck:     Thyroid: No thyromegaly.  Cardiovascular:     Rate and Rhythm: Normal rate and regular rhythm.     Heart sounds: Normal heart sounds.  Pulmonary:     Effort: Pulmonary effort is normal.     Breath sounds: Normal breath sounds.  Abdominal:     General: Bowel sounds are normal. There is no distension.     Palpations: Abdomen is soft. There is no mass.     Tenderness: There is no abdominal tenderness. There is no guarding or rebound.  Musculoskeletal:        General: Normal range of motion.     Cervical back: Normal range of motion and neck supple.  Lymphadenopathy:      Cervical: No cervical adenopathy.  Skin:    General: Skin is warm and dry.  Neurological:     Mental Status: He is alert and oriented to person, place, and time.  Deep Tendon Reflexes: Reflexes are normal and symmetric.  Psychiatric:        Behavior: Behavior normal.        Thought Content: Thought content normal.        Judgment: Judgment normal.     BP 136/79   Pulse 73   Ht 5\' 6"  (1.676 m)   Wt 225 lb (102.1 kg)   SpO2 97%   BMI 36.32 kg/m  Wt Readings from Last 3 Encounters:  06/20/20 225 lb (102.1 kg)  06/14/20 230 lb 8 oz (104.6 kg)  09/03/19 242 lb (109.8 kg)     There are no preventive care reminders to display for this patient.  There are no preventive care reminders to display for this patient.  Lab Results  Component Value Date   TSH 2.11 04/23/2016   Lab Results  Component Value Date   WBC 4.3 03/02/2019   HGB 15.7 03/02/2019   HCT 45.8 03/02/2019   MCV 90.9 03/02/2019   PLT 224 03/02/2019   Lab Results  Component Value Date   NA 140 03/02/2019   K 4.1 03/02/2019   CO2 30 03/02/2019   GLUCOSE 109 (H) 03/02/2019   BUN 18 03/02/2019   CREATININE 0.87 03/02/2019   BILITOT 1.0 03/02/2019   ALKPHOS 47 04/11/2017   AST 28 03/02/2019   ALT 40 03/02/2019   PROT 7.6 03/02/2019   ALBUMIN 4.6 04/11/2017   CALCIUM 9.8 03/02/2019   Lab Results  Component Value Date   CHOL 231 (H) 03/02/2019   Lab Results  Component Value Date   HDL 65 03/02/2019   Lab Results  Component Value Date   LDLCALC 141 (H) 03/02/2019   Lab Results  Component Value Date   TRIG 126 03/02/2019   Lab Results  Component Value Date   CHOLHDL 3.6 03/02/2019   Lab Results  Component Value Date   HGBA1C 5.7 (H) 03/02/2019      Assessment & Plan:   Problem List Items Addressed This Visit    None    Visit Diagnoses    Routine general medical examination at a health care facility    -  Primary   Relevant Orders   HgB A1c   COMPLETE METABOLIC PANEL WITH GFR    Lipid Panel w/reflex Direct LDL   PSA     Keep up a regular exercise program and make sure you are eating a healthy diet Try to eat 4 servings of dairy a day, or if you are lactose intolerant take a calcium with vitamin D daily.  Your vaccines are up to date.  He reports he has lost weight because he is really not eating much because of his increased anxiety levels.  Did encourage him to schedule a separate appointment so that we can discuss further and maybe even make adjustments to his medication regimen if needed. Did want to verify safe doses of Tylenol and ibuprofen and we reviewed that together. For his dry mouth we discussed using things like Biotene and XyliMelts to help moisturize the mouth as well as frequent sips of water.  No orders of the defined types were placed in this encounter.   Follow-up: Return in about 2 weeks (around 07/04/2020) for Anxiety.    07/06/2020, MD

## 2020-06-20 NOTE — Patient Instructions (Signed)

## 2020-06-21 ENCOUNTER — Other Ambulatory Visit: Payer: Self-pay

## 2020-06-21 DIAGNOSIS — F419 Anxiety disorder, unspecified: Secondary | ICD-10-CM

## 2020-06-21 LAB — HEMOGLOBIN A1C
Hgb A1c MFr Bld: 5.7 % of total Hgb — ABNORMAL HIGH (ref <5.7)
Mean Plasma Glucose: 117 (calc)
eAG (mmol/L): 6.5 (calc)

## 2020-06-21 LAB — COMPLETE METABOLIC PANEL WITH GFR
AG Ratio: 1.6 (calc) (ref 1.0–2.5)
ALT: 35 U/L (ref 9–46)
AST: 32 U/L (ref 10–35)
Albumin: 4.5 g/dL (ref 3.6–5.1)
Alkaline phosphatase (APISO): 49 U/L (ref 35–144)
BUN: 19 mg/dL (ref 7–25)
CO2: 27 mmol/L (ref 20–32)
Calcium: 10.1 mg/dL (ref 8.6–10.3)
Chloride: 100 mmol/L (ref 98–110)
Creat: 0.96 mg/dL (ref 0.70–1.25)
GFR, Est African American: 98 mL/min/{1.73_m2} (ref >=60)
GFR, Est Non African American: 84 mL/min/{1.73_m2} (ref >=60)
Globulin: 2.9 g/dL (calc) (ref 1.9–3.7)
Glucose, Bld: 91 mg/dL (ref 65–99)
Potassium: 3.9 mmol/L (ref 3.5–5.3)
Sodium: 141 mmol/L (ref 135–146)
Total Bilirubin: 1.2 mg/dL (ref 0.2–1.2)
Total Protein: 7.4 g/dL (ref 6.1–8.1)

## 2020-06-21 LAB — LIPID PANEL W/REFLEX DIRECT LDL
Cholesterol: 124 mg/dL (ref <200)
HDL: 46 mg/dL (ref >=40)
LDL Cholesterol (Calc): 59 mg/dL (calc)
Non-HDL Cholesterol (Calc): 78 mg/dL (calc) (ref <130)
Total CHOL/HDL Ratio: 2.7 (calc) (ref <5.0)
Triglycerides: 100 mg/dL (ref <150)

## 2020-06-21 LAB — PSA: PSA: 0.8 ng/mL (ref <=4.0)

## 2020-06-21 MED ORDER — HYDROXYZINE HCL 50 MG PO TABS: 25.0000 mg | ORAL_TABLET | Freq: Three times a day (TID) | ORAL | 1 refills | Status: DC | PRN

## 2020-06-21 MED ORDER — LIDOCAINE (ANORECTAL) 5 % EX CREA
1.0000 | TOPICAL_CREAM | Freq: Three times a day (TID) | CUTANEOUS | 99 refills | Status: DC | PRN
Start: 2020-06-21 — End: 2022-03-28

## 2020-06-21 NOTE — Telephone Encounter (Signed)
Lance Hernandez called and states his hemorrhoid cream has expired. He would like a refill. Not on current medication list.

## 2020-06-22 ENCOUNTER — Telehealth: Payer: Self-pay

## 2020-06-22 NOTE — Telephone Encounter (Signed)
Lance Hernandez called back and would also like a hemorrhoid cream that will help reduce the hemorrhoid. Please advise.

## 2020-06-22 NOTE — Telephone Encounter (Signed)
It was sent yesterday. Does the pharmacy not have it?

## 2020-06-23 MED ORDER — HYDROCORTISONE ACETATE 25 MG RE SUPP: 25.0000 mg | Freq: Two times a day (BID) | RECTAL | 99 refills | Status: DC

## 2020-06-23 MED ORDER — HYDROCORTISONE 1 % EX LOTN: 1.0000 "application " | TOPICAL_LOTION | Freq: Two times a day (BID) | CUTANEOUS | 3 refills | Status: DC

## 2020-06-23 NOTE — Telephone Encounter (Signed)
New prescription sent for suppositories as well as topical lotion which look like it was covered on his insurance plan.

## 2020-06-23 NOTE — Telephone Encounter (Signed)
He states the pharmacy stated the one that was sent in is for the pain not to help decrease the swelling.

## 2020-06-23 NOTE — Telephone Encounter (Signed)
Task completed. Pt advised both medications were sent to [facility] pharmacy. No other inquiries during call.

## 2020-07-05 ENCOUNTER — Other Ambulatory Visit: Payer: Self-pay

## 2020-07-05 ENCOUNTER — Ambulatory Visit (INDEPENDENT_AMBULATORY_CARE_PROVIDER_SITE_OTHER): Payer: BC Managed Care – PPO | Admitting: Family Medicine

## 2020-07-05 ENCOUNTER — Encounter: Payer: Self-pay | Admitting: Family Medicine

## 2020-07-05 VITALS — BP 144/74 | HR 73 | Temp 98.2°F | Wt 223.0 lb

## 2020-07-05 DIAGNOSIS — Z23 Encounter for immunization: Secondary | ICD-10-CM | POA: Diagnosis not present

## 2020-07-05 DIAGNOSIS — F419 Anxiety disorder, unspecified: Secondary | ICD-10-CM

## 2020-07-05 DIAGNOSIS — K649 Unspecified hemorrhoids: Secondary | ICD-10-CM | POA: Diagnosis not present

## 2020-07-05 DIAGNOSIS — F411 Generalized anxiety disorder: Secondary | ICD-10-CM

## 2020-07-05 DIAGNOSIS — H9311 Tinnitus, right ear: Secondary | ICD-10-CM

## 2020-07-05 DIAGNOSIS — I10 Essential (primary) hypertension: Secondary | ICD-10-CM

## 2020-07-05 MED ORDER — HYDROCORTISONE (PERIANAL) 2.5 % EX CREA: 1.0000 "application " | TOPICAL_CREAM | Freq: Two times a day (BID) | CUTANEOUS | 99 refills | Status: DC

## 2020-07-05 NOTE — Assessment & Plan Note (Signed)
Blood pressure is up today though it looks better about 2 weeks ago. He has lost about 2 more pounds but mostly from lack of appetite because of increased stress and anxiety. Again hopefully we can get that moderated than it will improve his sleep as well. Also encouraged him to try Atarax for anxiety this might even help him sleep at bedtime as well.

## 2020-07-05 NOTE — Assessment & Plan Note (Signed)
Discussed that I do recommend that he consider a trial of escitalopram discussed potential side effects and what to monitor for and our goals for care. I would really want to see him or at least hear back from him through my chart after about 3 to 3-1/2 weeks on the medication to see if he feels like it is helpful and to see if he is noticing any particular concerns or side effects that need to be addressed. Actually think this is a good medication and is typically well-tolerated and mild and encouraged him to at least consider trial treatment. Just reminded him that it can always be discontinued if he is not feeling well.

## 2020-07-05 NOTE — Progress Notes (Signed)
Established Patient Office Visit  Subjective:  Patient ID: Lance Hernandez, male    DOB: 09/15/1956  Age: 64 y.o. MRN: [id number]  CC:  Chief Complaint  Patient presents with  . Follow-up    Mood  . Tinnitus    Right    HPI Lance Ironra Deardorff presents for   Anxiety - he never started the lexapro. SAw potential side effects and this made him nervous. He read online that it could actually increase anxiety. So he has not started it..    Tinnitis in Right ear getting worse -he did see ENT on September 2 for this concern.  They had recommended an audiogram to evaluate for hearing loss and then follow-up in 6 weeks.  He was also told to continue his Astelin and Flonase and use mupirocin ointment in the nose. He does have a follow-up appointment coming up in early October. He just feels like they have not really addressed the tinnitus and wants to know what he could do that might be helpful.  He also wanted me to check his hemorrhoids and see if it actually is healing. He does feel like it is gotten smaller though it is still bleeding some. We have called in some lidocaine ointment as well as hydrocortisone 1% but he brought in an old tube of 2.5% that we had sent in a couple years ago.  Past Medical History:  Diagnosis Date  . Colitis   . DJD (degenerative joint disease)   . First degree AV block   . History of prostatitis   . Hyperlipidemia   . Hypertension   . Scoliosis     Past Surgical History:  Procedure Laterality Date  . TONSILLECTOMY      Family History  Problem Relation Age of Onset  . COPD Mother        Smoker  . COPD Sister        Smoker    Social History   Socioeconomic History  . Marital status: Married    Spouse name: susan   . Number of children: 1  . Years of education: Not on file  . Highest education level: Not on file  Occupational History  . Occupation: Airline pilotales    Comment: Computer work  Tobacco Use  . Smoking status: Never Smoker  . Smokeless tobacco: Never  Used  Substance and Sexual Activity  . Alcohol use: Yes    Comment: occasional - wine on holidays  . Drug use: No  . Sexual activity: Yes    Partners: Female  Other Topics Concern  . Not on file  Social History Narrative   Next caffeine. No regular exercise. He does plan on joining a gym. His wife is diabetic.   Social Determinants of Health   Financial Resource Strain:   . Difficulty of Paying Living Expenses: Not on file  Food Insecurity:   . Worried About Programme researcher, broadcasting/film/videounning Out of Food in the Last Year: Not on file  . Ran Out of Food in the Last Year: Not on file  Transportation Needs:   . Lack of Transportation (Medical): Not on file  . Lack of Transportation (Non-Medical): Not on file  Physical Activity:   . Days of Exercise per Week: Not on file  . Minutes of Exercise per Session: Not on file  Stress:   . Feeling of Stress : Not on file  Social Connections:   . Frequency of Communication with Friends and Family: Not on file  . Frequency of Social Gatherings  with Friends and Family: Not on file  . Attends Religious Services: Not on file  . Active Member of Clubs or Organizations: Not on file  . Attends Banker Meetings: Not on file  . Marital Status: Not on file  Intimate Partner Violence:   . Fear of Current or Ex-Partner: Not on file  . Emotionally Abused: Not on file  . Physically Abused: Not on file  . Sexually Abused: Not on file    Outpatient Medications Prior to Visit  Medication Sig Dispense Refill  . acetaminophen (TYLENOL) 325 MG tablet Take 650 mg by mouth every 6 (six) hours as needed.    Marland Kitchen atorvastatin (LIPITOR) 40 MG tablet Take 1 tablet (40 mg total) by mouth daily. Please cancel rx for pravastatin 90 tablet 3  . azelastine (ASTELIN) 0.1 % nasal spray Place into the nose.    . finasteride (PROSCAR) 5 MG tablet TAKE 1 TABLET DAILY 90 tablet 0  . fluticasone (FLONASE) 50 MCG/ACT nasal spray Place 2 sprays into both nostrils daily.    .  hydrocortisone (ANUSOL-HC) 25 MG suppository Place 1 suppository (25 mg total) rectally 2 (two) times daily. 12 suppository PRN  . hydrOXYzine (ATARAX/VISTARIL) 50 MG tablet Take 0.5-1 tablets (25-50 mg total) by mouth 3 (three) times daily as needed for anxiety. 45 tablet 1  . ibuprofen (ADVIL) 200 MG tablet Take 200 mg by mouth every 6 (six) hours as needed.    . Lidocaine, Anorectal, 5 % CREA Apply 1 application topically 3 (three) times daily as needed. 45 g PRN  . losartan (COZAAR) 100 MG tablet Take 1 tablet (100 mg total) by mouth daily. 90 tablet 0  . melatonin 5 MG TABS Take 5 mg by mouth at bedtime as needed.    . multivitamin (ONE-A-DAY MEN'S) TABS tablet Take 1 tablet by mouth daily. 90 tablet 3  . mupirocin ointment (BACTROBAN) 2 % SMARTSIG:Sparingly Both Nares Twice Daily    . omeprazole (PRILOSEC) 20 MG capsule Take 1 capsule (20 mg total) by mouth daily. 90 capsule 3  . hydrocortisone 1 % lotion Apply 1 application topically 2 (two) times daily. 118 mL 3   No facility-administered medications prior to visit.    Allergies  Allergen Reactions  . Phentermine Other (See Comments)    Nausea  . Prednisone Rash  . Sulfa Antibiotics Rash  . Tamsulosin Rash  . Tramadol Rash    ROS Review of Systems    Objective:    Physical Exam  BP (!) 144/74 (BP Location: Left Arm, Patient Position: Sitting, Cuff Size: Normal)   Pulse 73   Temp 98.2 F (36.8 C) (Oral)   Wt 223 lb (101.2 kg)   BMI 35.99 kg/m  Wt Readings from Last 3 Encounters:  07/05/20 223 lb (101.2 kg)  06/20/20 225 lb (102.1 kg)  06/14/20 230 lb 8 oz (104.6 kg)     There are no preventive care reminders to display for this patient.  There are no preventive care reminders to display for this patient.  Lab Results  Component Value Date   TSH 2.11 04/23/2016   Lab Results  Component Value Date   WBC 4.3 03/02/2019   HGB 15.7 03/02/2019   HCT 45.8 03/02/2019   MCV 90.9 03/02/2019   PLT 224  03/02/2019   Lab Results  Component Value Date   NA 141 06/20/2020   K 3.9 06/20/2020   CO2 27 06/20/2020   GLUCOSE 91 06/20/2020   BUN 19 06/20/2020  CREATININE 0.96 06/20/2020   BILITOT 1.2 06/20/2020   ALKPHOS 47 04/11/2017   AST 32 06/20/2020   ALT 35 06/20/2020   PROT 7.4 06/20/2020   ALBUMIN 4.6 04/11/2017   CALCIUM 10.1 06/20/2020   Lab Results  Component Value Date   CHOL 124 06/20/2020   Lab Results  Component Value Date   HDL 46 06/20/2020   Lab Results  Component Value Date   LDLCALC 59 06/20/2020   Lab Results  Component Value Date   TRIG 100 06/20/2020   Lab Results  Component Value Date   CHOLHDL 2.7 06/20/2020   Lab Results  Component Value Date   HGBA1C 5.7 (H) 06/20/2020      Assessment & Plan:   Problem List Items Addressed This Visit      Cardiovascular and Mediastinum   Hemorrhoids    We will send a new prescription for the hydrocortisone 2.5% cream which is a little stronger and see if this is helpful. The hemorrhoid does look like it is healing though there is still some active bleeding. Should continue to hopefully go down. We discussed that if continue to flare frequently then we may need to consider surgical consultation but will just need to monitor that.      Relevant Medications   hydrocortisone (ANUSOL-HC) 2.5 % rectal cream   Essential hypertension, benign    Blood pressure is up today though it looks better about 2 weeks ago. He has lost about 2 more pounds but mostly from lack of appetite because of increased stress and anxiety. Again hopefully we can get that moderated than it will improve his sleep as well. Also encouraged him to try Atarax for anxiety this might even help him sleep at bedtime as well.        Other   Tinnitus of right ear    Have a follow-up coming up with the ENT he does also have evidence of significant hearing loss and did bring a copy of the audiogram which will get scanned into his chart.  Unfortunately there is not any treatment that is curative. And most treatments only moderate the symptoms. We discussed using white noise especially when he is quieter in the evenings. There are sometimes surgeries that can be performed but they are not always successful. I do think helping to control his anxiety would help improve the annoyance and irritability caused by the tinnitus      GAD (generalized anxiety disorder)    Discussed that I do recommend that he consider a trial of escitalopram discussed potential side effects and what to monitor for and our goals for care. I would really want to see him or at least hear back from him through my chart after about 3 to 3-1/2 weeks on the medication to see if he feels like it is helpful and to see if he is noticing any particular concerns or side effects that need to be addressed. Actually think this is a good medication and is typically well-tolerated and mild and encouraged him to at least consider trial treatment. Just reminded him that it can always be discontinued if he is not feeling well.       Other Visit Diagnoses    Anxiety    -  Primary   Need for influenza vaccination       Relevant Orders   Flu Vaccine QUAD 6+ mos PF IM (Fluarix Quad PF) (Completed)      Meds ordered this encounter  Medications  .  hydrocortisone (ANUSOL-HC) 2.5 % rectal cream    Sig: Place 1 application rectally 2 (two) times daily.    Dispense:  30 g    Refill:  PRN    Follow-up: Return in about 3 weeks (around 07/26/2020) for New start medication.    Nani Gasser, MD

## 2020-07-05 NOTE — Assessment & Plan Note (Signed)
Have a follow-up coming up with the ENT he does also have evidence of significant hearing loss and did bring a copy of the audiogram which will get scanned into his chart. Unfortunately there is not any treatment that is curative. And most treatments only moderate the symptoms. We discussed using white noise especially when he is quieter in the evenings. There are sometimes surgeries that can be performed but they are not always successful. I do think helping to control his anxiety would help improve the annoyance and irritability caused by the tinnitus

## 2020-07-05 NOTE — Patient Instructions (Signed)
Encouraged to try starting the escitalopram.  Starting with a half a tab in the morning daily for 8 days and then increase to a whole tab if you are doing well.  Typically we follow-up in about 3 weeks after starting the medication to see how you are doing and make any adjustments that may need needed or to make changes if needed.

## 2020-07-05 NOTE — Assessment & Plan Note (Signed)
We will send a new prescription for the hydrocortisone 2.5% cream which is a little stronger and see if this is helpful. The hemorrhoid does look like it is healing though there is still some active bleeding. Should continue to hopefully go down. We discussed that if continue to flare frequently then we may need to consider surgical consultation but will just need to monitor that.

## 2020-07-31 ENCOUNTER — Other Ambulatory Visit: Payer: Self-pay | Admitting: Family Medicine

## 2020-08-01 DIAGNOSIS — H9311 Tinnitus, right ear: Secondary | ICD-10-CM | POA: Diagnosis not present

## 2020-08-01 DIAGNOSIS — F419 Anxiety disorder, unspecified: Secondary | ICD-10-CM | POA: Diagnosis not present

## 2020-08-01 DIAGNOSIS — H938X1 Other specified disorders of right ear: Secondary | ICD-10-CM | POA: Diagnosis not present

## 2020-08-01 DIAGNOSIS — H9041 Sensorineural hearing loss, unilateral, right ear, with unrestricted hearing on the contralateral side: Secondary | ICD-10-CM | POA: Diagnosis not present

## 2020-08-01 DIAGNOSIS — G479 Sleep disorder, unspecified: Secondary | ICD-10-CM | POA: Diagnosis not present

## 2020-08-01 DIAGNOSIS — H903 Sensorineural hearing loss, bilateral: Secondary | ICD-10-CM | POA: Diagnosis not present

## 2020-08-01 DIAGNOSIS — J329 Chronic sinusitis, unspecified: Secondary | ICD-10-CM | POA: Diagnosis not present

## 2020-08-01 DIAGNOSIS — R42 Dizziness and giddiness: Secondary | ICD-10-CM | POA: Diagnosis not present

## 2020-08-22 DIAGNOSIS — H903 Sensorineural hearing loss, bilateral: Secondary | ICD-10-CM | POA: Diagnosis not present

## 2020-08-22 DIAGNOSIS — H9311 Tinnitus, right ear: Secondary | ICD-10-CM | POA: Diagnosis not present

## 2020-08-22 DIAGNOSIS — H9041 Sensorineural hearing loss, unilateral, right ear, with unrestricted hearing on the contralateral side: Secondary | ICD-10-CM | POA: Diagnosis not present

## 2020-08-22 DIAGNOSIS — H938X1 Other specified disorders of right ear: Secondary | ICD-10-CM | POA: Diagnosis not present

## 2020-08-28 ENCOUNTER — Other Ambulatory Visit: Payer: Self-pay | Admitting: Family Medicine

## 2020-08-28 DIAGNOSIS — R3912 Poor urinary stream: Secondary | ICD-10-CM

## 2020-08-28 DIAGNOSIS — N401 Enlarged prostate with lower urinary tract symptoms: Secondary | ICD-10-CM

## 2020-09-12 ENCOUNTER — Other Ambulatory Visit: Payer: Self-pay | Admitting: *Deleted

## 2020-09-12 DIAGNOSIS — F419 Anxiety disorder, unspecified: Secondary | ICD-10-CM

## 2020-09-12 MED ORDER — HYDROXYZINE HCL 50 MG PO TABS: 25.0000 mg | ORAL_TABLET | Freq: Three times a day (TID) | ORAL | 3 refills | Status: DC | PRN

## 2020-09-29 DIAGNOSIS — H9041 Sensorineural hearing loss, unilateral, right ear, with unrestricted hearing on the contralateral side: Secondary | ICD-10-CM | POA: Diagnosis not present

## 2020-11-20 ENCOUNTER — Other Ambulatory Visit: Payer: Self-pay | Admitting: Family Medicine

## 2021-01-10 DIAGNOSIS — H25811 Combined forms of age-related cataract, right eye: Secondary | ICD-10-CM | POA: Diagnosis not present

## 2021-01-15 ENCOUNTER — Other Ambulatory Visit: Payer: Self-pay | Admitting: Family Medicine

## 2021-01-30 ENCOUNTER — Telehealth: Payer: Self-pay | Admitting: *Deleted

## 2021-01-30 NOTE — Telephone Encounter (Signed)
Pt left vm stating that "this is the worst pollen season he's ever seen since moving here" and is not only experiencing itchy eyes/ nose but is itchy all over his body.  He wants to know if you will send him something in to the [facility] on [address].

## 2021-01-30 NOTE — Telephone Encounter (Signed)
I would recommend Zyrtec at bedtime, Allegra 180 in the morning and starting flonase.  If this isn't helping then we can do a virtual appt.

## 2021-01-31 NOTE — Telephone Encounter (Signed)
Pt notified of provider recommendations and verbalized understanding.

## 2021-02-26 ENCOUNTER — Other Ambulatory Visit: Payer: Self-pay | Admitting: Family Medicine

## 2021-02-26 DIAGNOSIS — N401 Enlarged prostate with lower urinary tract symptoms: Secondary | ICD-10-CM

## 2021-03-07 DIAGNOSIS — H52203 Unspecified astigmatism, bilateral: Secondary | ICD-10-CM | POA: Diagnosis not present

## 2021-03-07 DIAGNOSIS — H25813 Combined forms of age-related cataract, bilateral: Secondary | ICD-10-CM | POA: Diagnosis not present

## 2021-03-07 DIAGNOSIS — H527 Unspecified disorder of refraction: Secondary | ICD-10-CM | POA: Diagnosis not present

## 2021-03-07 DIAGNOSIS — H02831 Dermatochalasis of right upper eyelid: Secondary | ICD-10-CM | POA: Diagnosis not present

## 2021-03-07 DIAGNOSIS — H35372 Puckering of macula, left eye: Secondary | ICD-10-CM | POA: Diagnosis not present

## 2021-04-06 DIAGNOSIS — H0015 Chalazion left lower eyelid: Secondary | ICD-10-CM | POA: Diagnosis not present

## 2021-04-06 DIAGNOSIS — H25813 Combined forms of age-related cataract, bilateral: Secondary | ICD-10-CM | POA: Diagnosis not present

## 2021-04-13 DIAGNOSIS — H0015 Chalazion left lower eyelid: Secondary | ICD-10-CM | POA: Diagnosis not present

## 2021-04-19 DIAGNOSIS — J449 Chronic obstructive pulmonary disease, unspecified: Secondary | ICD-10-CM | POA: Diagnosis not present

## 2021-04-19 DIAGNOSIS — Z79899 Other long term (current) drug therapy: Secondary | ICD-10-CM | POA: Diagnosis not present

## 2021-04-19 DIAGNOSIS — Z888 Allergy status to other drugs, medicaments and biological substances status: Secondary | ICD-10-CM | POA: Diagnosis not present

## 2021-04-19 DIAGNOSIS — E669 Obesity, unspecified: Secondary | ICD-10-CM | POA: Diagnosis not present

## 2021-04-19 DIAGNOSIS — H25811 Combined forms of age-related cataract, right eye: Secondary | ICD-10-CM | POA: Diagnosis not present

## 2021-04-19 DIAGNOSIS — Z882 Allergy status to sulfonamides status: Secondary | ICD-10-CM | POA: Diagnosis not present

## 2021-04-20 DIAGNOSIS — H25812 Combined forms of age-related cataract, left eye: Secondary | ICD-10-CM | POA: Diagnosis not present

## 2021-04-20 DIAGNOSIS — Z961 Presence of intraocular lens: Secondary | ICD-10-CM | POA: Diagnosis not present

## 2021-05-03 DIAGNOSIS — H35372 Puckering of macula, left eye: Secondary | ICD-10-CM | POA: Diagnosis not present

## 2021-05-03 DIAGNOSIS — H52203 Unspecified astigmatism, bilateral: Secondary | ICD-10-CM | POA: Diagnosis not present

## 2021-05-03 DIAGNOSIS — E669 Obesity, unspecified: Secondary | ICD-10-CM | POA: Diagnosis not present

## 2021-05-03 DIAGNOSIS — Z882 Allergy status to sulfonamides status: Secondary | ICD-10-CM | POA: Diagnosis not present

## 2021-05-03 DIAGNOSIS — I10 Essential (primary) hypertension: Secondary | ICD-10-CM | POA: Diagnosis not present

## 2021-05-03 DIAGNOSIS — H0015 Chalazion left lower eyelid: Secondary | ICD-10-CM | POA: Diagnosis not present

## 2021-05-03 DIAGNOSIS — Z888 Allergy status to other drugs, medicaments and biological substances status: Secondary | ICD-10-CM | POA: Diagnosis not present

## 2021-05-03 DIAGNOSIS — J449 Chronic obstructive pulmonary disease, unspecified: Secondary | ICD-10-CM | POA: Diagnosis not present

## 2021-05-03 DIAGNOSIS — H02831 Dermatochalasis of right upper eyelid: Secondary | ICD-10-CM | POA: Diagnosis not present

## 2021-05-03 DIAGNOSIS — H25811 Combined forms of age-related cataract, right eye: Secondary | ICD-10-CM | POA: Diagnosis not present

## 2021-05-03 DIAGNOSIS — H25812 Combined forms of age-related cataract, left eye: Secondary | ICD-10-CM | POA: Diagnosis not present

## 2021-05-03 DIAGNOSIS — N401 Enlarged prostate with lower urinary tract symptoms: Secondary | ICD-10-CM | POA: Diagnosis not present

## 2021-05-03 DIAGNOSIS — H43812 Vitreous degeneration, left eye: Secondary | ICD-10-CM | POA: Diagnosis not present

## 2021-05-03 DIAGNOSIS — H25813 Combined forms of age-related cataract, bilateral: Secondary | ICD-10-CM | POA: Diagnosis not present

## 2021-05-03 DIAGNOSIS — E785 Hyperlipidemia, unspecified: Secondary | ICD-10-CM | POA: Diagnosis not present

## 2021-05-03 DIAGNOSIS — H02834 Dermatochalasis of left upper eyelid: Secondary | ICD-10-CM | POA: Diagnosis not present

## 2021-05-04 DIAGNOSIS — Z961 Presence of intraocular lens: Secondary | ICD-10-CM | POA: Diagnosis not present

## 2021-05-04 DIAGNOSIS — H0015 Chalazion left lower eyelid: Secondary | ICD-10-CM | POA: Diagnosis not present

## 2021-05-04 DIAGNOSIS — H52203 Unspecified astigmatism, bilateral: Secondary | ICD-10-CM | POA: Diagnosis not present

## 2021-05-25 ENCOUNTER — Other Ambulatory Visit: Payer: Self-pay | Admitting: Family Medicine

## 2021-05-25 DIAGNOSIS — N401 Enlarged prostate with lower urinary tract symptoms: Secondary | ICD-10-CM

## 2021-05-25 NOTE — Telephone Encounter (Signed)
Please call pt and advise him that he will need to schedule his CPE and fasting labs due on or after 9/7.   Will send refill for now.

## 2021-05-25 NOTE — Telephone Encounter (Signed)
Called pt, no nswer, LVM to call and schedule an appt on or before 09/07 and that you are sending in RX for now- tvt

## 2021-05-30 ENCOUNTER — Encounter: Payer: Self-pay | Admitting: Family Medicine

## 2021-05-30 ENCOUNTER — Other Ambulatory Visit: Payer: Self-pay

## 2021-05-30 ENCOUNTER — Ambulatory Visit (INDEPENDENT_AMBULATORY_CARE_PROVIDER_SITE_OTHER): Payer: BC Managed Care – PPO | Admitting: Family Medicine

## 2021-05-30 VITALS — BP 160/96 | HR 95 | Ht 66.0 in | Wt 240.1 lb

## 2021-05-30 DIAGNOSIS — N401 Enlarged prostate with lower urinary tract symptoms: Secondary | ICD-10-CM

## 2021-05-30 DIAGNOSIS — R202 Paresthesia of skin: Secondary | ICD-10-CM

## 2021-05-30 DIAGNOSIS — J449 Chronic obstructive pulmonary disease, unspecified: Secondary | ICD-10-CM

## 2021-05-30 DIAGNOSIS — Z23 Encounter for immunization: Secondary | ICD-10-CM | POA: Diagnosis not present

## 2021-05-30 DIAGNOSIS — R3912 Poor urinary stream: Secondary | ICD-10-CM | POA: Diagnosis not present

## 2021-05-30 DIAGNOSIS — R7301 Impaired fasting glucose: Secondary | ICD-10-CM | POA: Diagnosis not present

## 2021-05-30 DIAGNOSIS — I10 Essential (primary) hypertension: Secondary | ICD-10-CM

## 2021-05-30 MED ORDER — HYDROCHLOROTHIAZIDE 12.5 MG PO CAPS: 12.5000 mg | ORAL_CAPSULE | Freq: Every day | ORAL | 0 refills | Status: DC

## 2021-05-30 MED ORDER — ALBUTEROL SULFATE HFA 108 (90 BASE) MCG/ACT IN AERS: 2.0000 | INHALATION_SPRAY | Freq: Four times a day (QID) | RESPIRATORY_TRACT | 2 refills | Status: DC | PRN

## 2021-05-30 NOTE — Assessment & Plan Note (Signed)
Plan to recheck A1C

## 2021-05-30 NOTE — Assessment & Plan Note (Signed)
Due to recheck PSA.  °

## 2021-05-30 NOTE — Progress Notes (Signed)
He would like to discuss issues with COPD (phlegm and cough). He is also concerned about being diabetic based on eye exam .

## 2021-05-30 NOTE — Assessment & Plan Note (Signed)
No diagnosis or sign of diabetes at this point.  Consider deficiencies as potential cause so we will check a hemoglobin as well as a B12 level.

## 2021-05-30 NOTE — Assessment & Plan Note (Signed)
Blood pressure uncontrolled today just got 90-day supply for losartan.  We will add 12.5 mg of HCTZ to current regimen.  Continue to work on healthy diet regular exercise and weight loss.

## 2021-05-30 NOTE — Assessment & Plan Note (Signed)
Increased mucus and cough for 2 weeks.  Discussed using his albuterol twice a day for the next week to see if symptoms improve if they do not then please let me know chest exam is clear today.

## 2021-05-30 NOTE — Progress Notes (Signed)
Established Patient Office Visit  Subjective:  Patient ID: Lance Hernandez, male    DOB: 1956-03-07  Age: 65 y.o. MRN: [id number]  CC:  Chief Complaint  Patient presents with   medication review    HPI Lance Hernandez presents for  Impaired fasting glucose-no increased thirst or urination. No symptoms consistent with hypoglycemia. His eye doc was concerned about diabetes.    He reports that he has had increased mucus with really thick phlegm daily for the last 2 weeks as well as a cough.  No shortness of breath or wheezing.  He says he always has a lot of mucus but it is more than usual.  No fevers or chills or sweats.  Complains of constant numbness in the toes specially specifically on the left foot compared to the right he is wondering if he could be developing neuropathy.  Past Medical History:  Diagnosis Date   Colitis    DJD (degenerative joint disease)    First degree AV block    History of prostatitis    Hyperlipidemia    Hypertension    Scoliosis     Past Surgical History:  Procedure Laterality Date   TONSILLECTOMY      Family History  Problem Relation Age of Onset   COPD Mother        Smoker   COPD Sister        Smoker    Social History   Socioeconomic History   Marital status: Married    Spouse name: susan    Number of children: 1   Years of education: Not on file   Highest education level: Not on file  Occupational History   Occupation: Airline pilot    Comment: Computer work  Tobacco Use   Smoking status: Never   Smokeless tobacco: Never  Substance and Sexual Activity   Alcohol use: Yes    Comment: occasional - wine on holidays   Drug use: No   Sexual activity: Yes    Partners: Female  Other Topics Concern   Not on file  Social History Narrative   Next caffeine. No regular exercise. He does plan on joining a gym. His wife is diabetic.   Social Determinants of Health   Financial Resource Strain: Not on file  Food Insecurity: Not on file   Transportation Needs: Not on file  Physical Activity: Not on file  Stress: Not on file  Social Connections: Not on file  Intimate Partner Violence: Not on file    Outpatient Medications Prior to Visit  Medication Sig Dispense Refill   acetaminophen (TYLENOL) 325 MG tablet Take 650 mg by mouth every 6 (six) hours as needed.     atorvastatin (LIPITOR) 40 MG tablet TAKE 1 TABLET DAILY 90 tablet 3   azelastine (ASTELIN) 0.1 % nasal spray Place into the nose.     finasteride (PROSCAR) 5 MG tablet Take 1 tablet (5 mg total) by mouth daily. 90 tablet 3   fluticasone (FLONASE) 50 MCG/ACT nasal spray Place 2 sprays into both nostrils daily.     hydrocortisone (ANUSOL-HC) 2.5 % rectal cream Place 1 application rectally 2 (two) times daily. 30 g PRN   hydrocortisone (ANUSOL-HC) 25 MG suppository Place 1 suppository (25 mg total) rectally 2 (two) times daily. 12 suppository PRN   hydrOXYzine (ATARAX/VISTARIL) 50 MG tablet Take 0.5-1 tablets (25-50 mg total) by mouth 3 (three) times daily as needed for anxiety. 45 tablet 3   ibuprofen (ADVIL) 200 MG tablet Take 200 mg by  mouth every 6 (six) hours as needed.     Lidocaine, Anorectal, 5 % CREA Apply 1 application topically 3 (three) times daily as needed. 45 g PRN   losartan (COZAAR) 100 MG tablet TAKE 1 TABLET DAILY 90 tablet 0   melatonin 5 MG TABS Take 5 mg by mouth at bedtime as needed.     multivitamin (ONE-A-DAY MEN'S) TABS tablet Take 1 tablet by mouth daily. 90 tablet 3   mupirocin ointment (BACTROBAN) 2 % SMARTSIG:Sparingly Both Nares Twice Daily     omeprazole (PRILOSEC) 20 MG capsule TAKE 1 CAPSULE DAILY 90 capsule 3   No facility-administered medications prior to visit.    Allergies  Allergen Reactions   Aspirin Other (See Comments)    "It's been decades ago. I don't remember"   Phentermine Other (See Comments)    Nausea   Prednisone Rash   Sulfa Antibiotics Rash   Tamsulosin Rash   Tramadol Rash    ROS Review of Systems     Objective:    Physical Exam Constitutional:      Appearance: Normal appearance. He is well-developed.  HENT:     Head: Normocephalic and atraumatic.  Cardiovascular:     Rate and Rhythm: Normal rate and regular rhythm.     Heart sounds: Normal heart sounds.  Pulmonary:     Effort: Pulmonary effort is normal.     Breath sounds: Normal breath sounds.  Skin:    General: Skin is warm and dry.  Neurological:     Mental Status: He is alert and oriented to person, place, and time. Mental status is at baseline.  Psychiatric:        Behavior: Behavior normal.    BP (!) 160/96   Pulse 95   Ht 5\' 6"  (1.676 m)   Wt 240 lb 1.4 oz (108.9 kg)   SpO2 98%   BMI 38.75 kg/m  Wt Readings from Last 3 Encounters:  05/30/21 240 lb 1.4 oz (108.9 kg)  07/05/20 223 lb (101.2 kg)  06/20/20 225 lb (102.1 kg)     Health Maintenance Due  Topic Date Due   Pneumococcal Vaccine 9-45 Years old (2 - PCV) 06/18/2017   COVID-19 Vaccine (3 - Booster for Pfizer series) 06/27/2020   INFLUENZA VACCINE  05/14/2021    There are no preventive care reminders to display for this patient.  Lab Results  Component Value Date   TSH 2.11 04/23/2016   Lab Results  Component Value Date   WBC 4.3 03/02/2019   HGB 15.7 03/02/2019   HCT 45.8 03/02/2019   MCV 90.9 03/02/2019   PLT 224 03/02/2019   Lab Results  Component Value Date   NA 141 06/20/2020   K 3.9 06/20/2020   CO2 27 06/20/2020   GLUCOSE 91 06/20/2020   BUN 19 06/20/2020   CREATININE 0.96 06/20/2020   BILITOT 1.2 06/20/2020   ALKPHOS 47 04/11/2017   AST 32 06/20/2020   ALT 35 06/20/2020   PROT 7.4 06/20/2020   ALBUMIN 4.6 04/11/2017   CALCIUM 10.1 06/20/2020   Lab Results  Component Value Date   CHOL 124 06/20/2020   Lab Results  Component Value Date   HDL 46 06/20/2020   Lab Results  Component Value Date   LDLCALC 59 06/20/2020   Lab Results  Component Value Date   TRIG 100 06/20/2020   Lab Results  Component Value Date    CHOLHDL 2.7 06/20/2020   Lab Results  Component Value Date   HGBA1C  5.7 (H) 06/20/2020      Assessment & Plan:   Problem List Items Addressed This Visit       Cardiovascular and Mediastinum   Essential hypertension, benign    Blood pressure uncontrolled today just got 90-day supply for losartan.  We will add 12.5 mg of HCTZ to current regimen.  Continue to work on healthy diet regular exercise and weight loss.      Relevant Medications   hydrochlorothiazide (MICROZIDE) 12.5 MG capsule   Other Relevant Orders   Lipid Profile   COMPLETE METABOLIC PANEL WITH GFR   PSA   HgB A1c     Respiratory   COPD, group B, by GOLD 2017 classification (HCC)    Increased mucus and cough for 2 weeks.  Discussed using his albuterol twice a day for the next week to see if symptoms improve if they do not then please let me know chest exam is clear today.      Relevant Medications   albuterol (VENTOLIN HFA) 108 (90 Base) MCG/ACT inhaler     Endocrine   IFG (impaired fasting glucose)    Plan to recheck A1C       Relevant Orders   Lipid Profile   COMPLETE METABOLIC PANEL WITH GFR   PSA   HgB A1c     Genitourinary   BPH (benign prostatic hyperplasia)    Due to recheck PSA.      Relevant Orders   Lipid Profile   COMPLETE METABOLIC PANEL WITH GFR   PSA   HgB A1c     Other   Paresthesia of both feet    No diagnosis or sign of diabetes at this point.  Consider deficiencies as potential cause so we will check a hemoglobin as well as a B12 level.      Relevant Orders   B12   CBC   Other Visit Diagnoses     Need for pneumococcal vaccination    -  Primary   Relevant Orders   Pneumococcal conjugate vaccine 20-valent (Prevnar 20) (Completed)       Meds ordered this encounter  Medications   albuterol (VENTOLIN HFA) 108 (90 Base) MCG/ACT inhaler    Sig: Inhale 2 puffs into the lungs every 6 (six) hours as needed for wheezing or shortness of breath.    Dispense:  8 g     Refill:  2   hydrochlorothiazide (MICROZIDE) 12.5 MG capsule    Sig: Take 1 capsule (12.5 mg total) by mouth daily.    Dispense:  90 capsule    Refill:  0     Follow-up: No follow-ups on file.    Nani Gasser, MD

## 2021-05-31 ENCOUNTER — Encounter: Payer: Self-pay | Admitting: Family Medicine

## 2021-05-31 LAB — COMPLETE METABOLIC PANEL WITH GFR
AG Ratio: 1.7 (calc) (ref 1.0–2.5)
ALT: 39 U/L (ref 9–46)
AST: 29 U/L (ref 10–35)
Albumin: 4.8 g/dL (ref 3.6–5.1)
Alkaline phosphatase (APISO): 51 U/L (ref 35–144)
BUN: 15 mg/dL (ref 7–25)
CO2: 29 mmol/L (ref 20–32)
Calcium: 10.1 mg/dL (ref 8.6–10.3)
Chloride: 103 mmol/L (ref 98–110)
Creat: 1.01 mg/dL (ref 0.70–1.35)
Globulin: 2.9 g/dL (calc) (ref 1.9–3.7)
Glucose, Bld: 127 mg/dL — ABNORMAL HIGH (ref 65–99)
Potassium: 4.3 mmol/L (ref 3.5–5.3)
Sodium: 141 mmol/L (ref 135–146)
Total Bilirubin: 1.5 mg/dL — ABNORMAL HIGH (ref 0.2–1.2)
Total Protein: 7.7 g/dL (ref 6.1–8.1)
eGFR: 84 mL/min/{1.73_m2} (ref >=60)

## 2021-05-31 LAB — CBC
HCT: 47.3 % (ref 38.5–50.0)
Hemoglobin: 16 g/dL (ref 13.2–17.1)
MCH: 31.4 pg (ref 27.0–33.0)
MCHC: 33.8 g/dL (ref 32.0–36.0)
MCV: 92.9 fL (ref 80.0–100.0)
MPV: 10.3 fL (ref 7.5–12.5)
Platelets: 219 10*3/uL (ref 140–400)
RBC: 5.09 10*6/uL (ref 4.20–5.80)
RDW: 12.4 % (ref 11.0–15.0)
WBC: 4.2 10*3/uL (ref 3.8–10.8)

## 2021-05-31 LAB — HEMOGLOBIN A1C
Hgb A1c MFr Bld: 5.8 % of total Hgb — ABNORMAL HIGH (ref <5.7)
Mean Plasma Glucose: 120 mg/dL
eAG (mmol/L): 6.6 mmol/L

## 2021-05-31 LAB — LIPID PANEL
Cholesterol: 171 mg/dL (ref <200)
HDL: 57 mg/dL (ref >=40)
LDL Cholesterol (Calc): 90 mg/dL (calc)
Non-HDL Cholesterol (Calc): 114 mg/dL (calc) (ref <130)
Total CHOL/HDL Ratio: 3 (calc) (ref <5.0)
Triglycerides: 144 mg/dL (ref <150)

## 2021-05-31 LAB — VITAMIN B12: Vitamin B-12: 324 pg/mL (ref 200–1100)

## 2021-05-31 LAB — PSA: PSA: 0.7 ng/mL (ref <=4.00)

## 2021-06-20 ENCOUNTER — Telehealth: Payer: Self-pay | Admitting: Family Medicine

## 2021-06-20 ENCOUNTER — Other Ambulatory Visit: Payer: Self-pay

## 2021-06-20 ENCOUNTER — Encounter: Payer: Self-pay | Admitting: Family Medicine

## 2021-06-20 ENCOUNTER — Ambulatory Visit (INDEPENDENT_AMBULATORY_CARE_PROVIDER_SITE_OTHER): Payer: BC Managed Care – PPO | Admitting: Family Medicine

## 2021-06-20 VITALS — BP 148/75 | HR 77 | Ht 66.0 in | Wt 238.7 lb

## 2021-06-20 DIAGNOSIS — K8020 Calculus of gallbladder without cholecystitis without obstruction: Secondary | ICD-10-CM | POA: Diagnosis not present

## 2021-06-20 DIAGNOSIS — I440 Atrioventricular block, first degree: Secondary | ICD-10-CM

## 2021-06-20 DIAGNOSIS — R079 Chest pain, unspecified: Secondary | ICD-10-CM | POA: Diagnosis not present

## 2021-06-20 DIAGNOSIS — J168 Pneumonia due to other specified infectious organisms: Secondary | ICD-10-CM | POA: Diagnosis not present

## 2021-06-20 DIAGNOSIS — Z23 Encounter for immunization: Secondary | ICD-10-CM

## 2021-06-20 DIAGNOSIS — R1310 Dysphagia, unspecified: Secondary | ICD-10-CM | POA: Diagnosis not present

## 2021-06-20 DIAGNOSIS — R9431 Abnormal electrocardiogram [ECG] [EKG]: Secondary | ICD-10-CM

## 2021-06-20 DIAGNOSIS — R0789 Other chest pain: Secondary | ICD-10-CM

## 2021-06-20 DIAGNOSIS — R918 Other nonspecific abnormal finding of lung field: Secondary | ICD-10-CM | POA: Diagnosis not present

## 2021-06-20 DIAGNOSIS — Z79899 Other long term (current) drug therapy: Secondary | ICD-10-CM | POA: Diagnosis not present

## 2021-06-20 DIAGNOSIS — K5790 Diverticulosis of intestine, part unspecified, without perforation or abscess without bleeding: Secondary | ICD-10-CM | POA: Diagnosis not present

## 2021-06-20 DIAGNOSIS — I10 Essential (primary) hypertension: Secondary | ICD-10-CM

## 2021-06-20 DIAGNOSIS — N281 Cyst of kidney, acquired: Secondary | ICD-10-CM | POA: Diagnosis not present

## 2021-06-20 DIAGNOSIS — J449 Chronic obstructive pulmonary disease, unspecified: Secondary | ICD-10-CM | POA: Diagnosis not present

## 2021-06-20 DIAGNOSIS — J189 Pneumonia, unspecified organism: Secondary | ICD-10-CM | POA: Diagnosis not present

## 2021-06-20 DIAGNOSIS — J984 Other disorders of lung: Secondary | ICD-10-CM | POA: Diagnosis not present

## 2021-06-20 DIAGNOSIS — K4020 Bilateral inguinal hernia, without obstruction or gangrene, not specified as recurrent: Secondary | ICD-10-CM | POA: Diagnosis not present

## 2021-06-20 NOTE — Progress Notes (Signed)
Established Patient Office Visit  Subjective:  Patient ID: Lance Hernandez, male    DOB: 1956/10/06  Age: 65 y.o. MRN: [id number]  CC: No chief complaint on file.   HPI Lance Hernandez presents for   Atypical Chest pain -he reports that he has had some intermittent chest pain on and off for a long time it usually feels like a squeezing sensation in the middle of his chest or really even a cramp.  But he says today he is experiencing it and feels like it is radiating towards his left shoulder which she does not normally experience.  He would like to get the flu shot today.  He also brought in his home blood pressure cuff because he says he normally gets great blood pressures at home but they are always higher here.  It is a wrist cuff.  Also reported that food occasionally "hangs" right near his throat.  He has had to have an esophageal dilatation in the past back in 2016.  Past Medical History:  Diagnosis Date   Colitis    DJD (degenerative joint disease)    First degree AV block    History of prostatitis    Hyperlipidemia    Hypertension    Scoliosis     Past Surgical History:  Procedure Laterality Date   CATARACT EXTRACTION, BILATERAL Bilateral    TONSILLECTOMY      Family History  Problem Relation Age of Onset   COPD Mother        Smoker   COPD Sister        Smoker    Social History   Socioeconomic History   Marital status: Married    Spouse name: susan    Number of children: 1   Years of education: Not on file   Highest education level: Not on file  Occupational History   Occupation: Press photographer    Comment: Computer work  Tobacco Use   Smoking status: Never   Smokeless tobacco: Never  Substance and Sexual Activity   Alcohol use: Yes    Comment: occasional - wine on holidays   Drug use: No   Sexual activity: Yes    Partners: Female  Other Topics Concern   Not on file  Social History Narrative   Next caffeine. No regular exercise. He does plan on joining a gym.  His wife is diabetic.   Social Determinants of Health   Financial Resource Strain: Not on file  Food Insecurity: Not on file  Transportation Needs: Not on file  Physical Activity: Not on file  Stress: Not on file  Social Connections: Not on file  Intimate Partner Violence: Not on file    Outpatient Medications Prior to Visit  Medication Sig Dispense Refill   acetaminophen (TYLENOL) 325 MG tablet Take 650 mg by mouth every 6 (six) hours as needed.     albuterol (VENTOLIN HFA) 108 (90 Base) MCG/ACT inhaler Inhale 2 puffs into the lungs every 6 (six) hours as needed for wheezing or shortness of breath. 8 g 2   atorvastatin (LIPITOR) 40 MG tablet TAKE 1 TABLET DAILY 90 tablet 3   azelastine (ASTELIN) 0.1 % nasal spray Place into the nose.     finasteride (PROSCAR) 5 MG tablet Take 1 tablet (5 mg total) by mouth daily. 90 tablet 3   fluticasone (FLONASE) 50 MCG/ACT nasal spray Place 2 sprays into both nostrils daily.     hydrochlorothiazide (MICROZIDE) 12.5 MG capsule Take 1 capsule (12.5 mg total) by mouth  daily. 90 capsule 0   hydrocortisone (ANUSOL-HC) 2.5 % rectal cream Place 1 application rectally 2 (two) times daily. 30 g PRN   hydrocortisone (ANUSOL-HC) 25 MG suppository Place 1 suppository (25 mg total) rectally 2 (two) times daily. 12 suppository PRN   hydrOXYzine (ATARAX/VISTARIL) 50 MG tablet Take 0.5-1 tablets (25-50 mg total) by mouth 3 (three) times daily as needed for anxiety. 45 tablet 3   ibuprofen (ADVIL) 200 MG tablet Take 200 mg by mouth every 6 (six) hours as needed.     Lidocaine, Anorectal, 5 % CREA Apply 1 application topically 3 (three) times daily as needed. 45 g PRN   losartan (COZAAR) 100 MG tablet TAKE 1 TABLET DAILY 90 tablet 0   melatonin 5 MG TABS Take 5 mg by mouth at bedtime as needed.     multivitamin (ONE-A-DAY MEN'S) TABS tablet Take 1 tablet by mouth daily. 90 tablet 3   mupirocin ointment (BACTROBAN) 2 % SMARTSIG:Sparingly Both Nares Twice Daily      omeprazole (PRILOSEC) 20 MG capsule TAKE 1 CAPSULE DAILY 90 capsule 3   No facility-administered medications prior to visit.    Allergies  Allergen Reactions   Aspirin Other (See Comments)    "It's been decades ago. I don't remember"   Phentermine Other (See Comments)    Nausea   Prednisone Rash   Sulfa Antibiotics Rash   Tamsulosin Rash   Tramadol Rash    ROS Review of Systems    Objective:    Physical Exam Constitutional:      Appearance: Normal appearance. He is well-developed.  HENT:     Head: Normocephalic and atraumatic.  Cardiovascular:     Rate and Rhythm: Normal rate and regular rhythm.     Heart sounds: Normal heart sounds.  Pulmonary:     Effort: Pulmonary effort is normal.     Breath sounds: Normal breath sounds.  Skin:    General: Skin is warm and dry.  Neurological:     Mental Status: He is alert and oriented to person, place, and time. Mental status is at baseline.  Psychiatric:        Behavior: Behavior normal.    BP (!) 148/75   Pulse 77   Ht '5\' 6"'  (1.676 m)   Wt 238 lb 11.2 oz (108.3 kg)   SpO2 96%   BMI 38.53 kg/m  Wt Readings from Last 3 Encounters:  06/20/21 238 lb 11.2 oz (108.3 kg)  05/30/21 240 lb 1.4 oz (108.9 kg)  07/05/20 223 lb (101.2 kg)     Health Maintenance Due  Topic Date Due   Pneumococcal Vaccine 81-59 Years old (2 - PCV) 06/18/2017   COVID-19 Vaccine (3 - Booster for Pfizer series) 06/27/2020    There are no preventive care reminders to display for this patient.  Lab Results  Component Value Date   TSH 2.11 04/23/2016   Lab Results  Component Value Date   WBC 4.2 05/30/2021   HGB 16.0 05/30/2021   HCT 47.3 05/30/2021   MCV 92.9 05/30/2021   PLT 219 05/30/2021   Lab Results  Component Value Date   NA 141 05/30/2021   K 4.3 05/30/2021   CO2 29 05/30/2021   GLUCOSE 127 (H) 05/30/2021   BUN 15 05/30/2021   CREATININE 1.01 05/30/2021   BILITOT 1.5 (H) 05/30/2021   ALKPHOS 47 04/11/2017   AST 29  05/30/2021   ALT 39 05/30/2021   PROT 7.7 05/30/2021   ALBUMIN 4.6 04/11/2017  CALCIUM 10.1 05/30/2021   EGFR 84 05/30/2021   Lab Results  Component Value Date   CHOL 171 05/30/2021   Lab Results  Component Value Date   HDL 57 05/30/2021   Lab Results  Component Value Date   LDLCALC 90 05/30/2021   Lab Results  Component Value Date   TRIG 144 05/30/2021   Lab Results  Component Value Date   CHOLHDL 3.0 05/30/2021   Lab Results  Component Value Date   HGBA1C 5.8 (H) 05/30/2021      Assessment & Plan:   Problem List Items Addressed This Visit       Cardiovascular and Mediastinum   First degree AV block    New on EKG today. Will refer to Cardiology for further workup.        Relevant Orders   Ambulatory referral to Cardiology   Essential hypertension, benign    Brought in home blood pressure cuff for comparison.  Though it is a wrist cuff which may affect the accuracy somewhat.      Other Visit Diagnoses     Atypical chest pain    -  Primary   Relevant Orders   EKG 12-Lead   Ambulatory referral to Cardiology   Troponin T   TSH   Dysphagia, unspecified type       Relevant Orders   Ambulatory referral to Gastroenterology   Need for immunization against influenza       Relevant Orders   Flu Vaccine QUAD 59moIM (Fluarix, Fluzone & Alfiuria Quad PF) (Completed)   Abnormal EKG       Relevant Orders   Ambulatory referral to Cardiology      Atypical chest pain-unclear etiology at this point we will do additional work-up since the pain has been recurrent for years but today seems to be a little bit different in nature.  We will get an EKG today and consider lab work-up.  EKG shows rate of 74 bpm, normal sinus rhythm, wieth new onset prolonged PR interval that was not present 5 years ago.     Dysphagia - -discussed that it could be an esophageal stricture and recommend referral to GI for further work-up.   No orders of the defined types were placed in  this encounter.   Follow-up: No follow-ups on file.    CBeatrice Lecher MD

## 2021-06-20 NOTE — Assessment & Plan Note (Signed)
Brought in home blood pressure cuff for comparison.  Though it is a wrist cuff which may affect the accuracy somewhat.

## 2021-06-20 NOTE — Assessment & Plan Note (Signed)
New on EKG today. Will refer to Cardiology for further workup.

## 2021-06-20 NOTE — Telephone Encounter (Signed)
Labs not back so called patient to go to ED for further w/u. He is stil having midsternal CP radiating into his back. They he says is really more of a discomfort than a "pain". Occ radiating into his shoulders.

## 2021-06-21 ENCOUNTER — Telehealth: Payer: Self-pay | Admitting: *Deleted

## 2021-06-21 ENCOUNTER — Telehealth: Payer: Self-pay | Admitting: Cardiology

## 2021-06-21 NOTE — Telephone Encounter (Signed)
I spoke with patient.  He was last seen by Dr Jens Som in 2013.  Patient does not remember this visit but does recall having a stress test.  Chart indicates patient saw [facility] cardiology in 2016 but patient does not remember this visit.  He reports ED recommended he possibly have stress test and echo.  He is feeling a "tiny bit" better today.  Has been prescribed Doxycyline for possible pneumonia. Patient would like to follow with [facility].  I advised him to keep scheduled appointment with Dr Jens Som on 08/15/21 and to follow up with PCP.  I told him PCP could contact [facility] if it was felt sooner appointment was needed.

## 2021-06-21 NOTE — Telephone Encounter (Signed)
New message:      Patient calling stating that he was at [facility] ER yesterday and states that he need to see the doctor in 2 days and I told the patient he doesn't have anything till 08/14/21. However he is insisting  to speak with a nurse. I asked would he like to see some else he said no.

## 2021-06-21 NOTE — Telephone Encounter (Signed)
I called [facility] to get an earlier appointment with any provider.  There are no openings until November.  I am sending this message to Deliah Goody, Dr. Ludwig Clarks nurse to see if they can work the patient in sooner.    Stanton Kidney,  If you are able to work this patient in, please let Okey Dupre know.  I am on PAL next week and will not be here.  If you can TEAMS or send her a secure chat in Epic, I would greatly appreciate it.  Thanks so much!!

## 2021-06-21 NOTE — Telephone Encounter (Signed)
It looks like he has an opening on September 21 in [place].  Would we be able to grab that slot.

## 2021-06-21 NOTE — Telephone Encounter (Signed)
Pt called and stated that he went to the ED last night and was told that he will need to f/u with Cardiology. Because he has [employer] he would like to be seen by Dr. Jens Som that is in his network. He stated that he has an appt with Dr. Jens Som on 11/2 however he will need to get his appointment moved due to his recent ED visit.   Gave referral to [employer]. And asked that she get this done STAT. She is working on this today.

## 2021-06-21 NOTE — Telephone Encounter (Signed)
Please see Lance Hernandez's note and that she has reached out to [name] nurse

## 2021-06-22 DIAGNOSIS — I440 Atrioventricular block, first degree: Secondary | ICD-10-CM | POA: Diagnosis not present

## 2021-06-22 NOTE — Telephone Encounter (Signed)
Pt called and stated that [facility] called and got him scheduled for Monday September 12th @ 140 PM. He reports that they ARE in his network. Pt states that he will double check with [employer] to be sure that they ARE in his network.  I told him that we will d/c the referral and he should keep the appt that was made with [facility]. Will fwd to pcp and Victorino Dike to update this.

## 2021-06-25 DIAGNOSIS — R0789 Other chest pain: Secondary | ICD-10-CM | POA: Diagnosis not present

## 2021-06-25 DIAGNOSIS — I440 Atrioventricular block, first degree: Secondary | ICD-10-CM | POA: Diagnosis not present

## 2021-06-25 DIAGNOSIS — I10 Essential (primary) hypertension: Secondary | ICD-10-CM | POA: Diagnosis not present

## 2021-06-25 DIAGNOSIS — T8182XD Emphysema (subcutaneous) resulting from a procedure, subsequent encounter: Secondary | ICD-10-CM | POA: Diagnosis not present

## 2021-06-25 LAB — TROPONIN T: Troponin T TROPT: <0.01 ng/mL (ref <0.01)

## 2021-06-25 LAB — TSH: TSH: 1.98 mIU/L (ref 0.40–4.50)

## 2021-06-26 DIAGNOSIS — R079 Chest pain, unspecified: Secondary | ICD-10-CM | POA: Diagnosis not present

## 2021-06-26 DIAGNOSIS — R0789 Other chest pain: Secondary | ICD-10-CM | POA: Diagnosis not present

## 2021-06-26 DIAGNOSIS — Z886 Allergy status to analgesic agent status: Secondary | ICD-10-CM | POA: Diagnosis not present

## 2021-06-26 DIAGNOSIS — J449 Chronic obstructive pulmonary disease, unspecified: Secondary | ICD-10-CM | POA: Diagnosis not present

## 2021-06-26 DIAGNOSIS — Z888 Allergy status to other drugs, medicaments and biological substances status: Secondary | ICD-10-CM | POA: Diagnosis not present

## 2021-06-26 DIAGNOSIS — E876 Hypokalemia: Secondary | ICD-10-CM | POA: Diagnosis not present

## 2021-06-26 DIAGNOSIS — J9811 Atelectasis: Secondary | ICD-10-CM | POA: Diagnosis not present

## 2021-06-26 DIAGNOSIS — R1013 Epigastric pain: Secondary | ICD-10-CM | POA: Diagnosis not present

## 2021-06-26 DIAGNOSIS — I440 Atrioventricular block, first degree: Secondary | ICD-10-CM | POA: Diagnosis not present

## 2021-06-26 DIAGNOSIS — Z882 Allergy status to sulfonamides status: Secondary | ICD-10-CM | POA: Diagnosis not present

## 2021-06-26 DIAGNOSIS — E785 Hyperlipidemia, unspecified: Secondary | ICD-10-CM | POA: Diagnosis not present

## 2021-06-26 DIAGNOSIS — Z79899 Other long term (current) drug therapy: Secondary | ICD-10-CM | POA: Diagnosis not present

## 2021-06-26 DIAGNOSIS — I10 Essential (primary) hypertension: Secondary | ICD-10-CM | POA: Diagnosis not present

## 2021-06-26 DIAGNOSIS — Z885 Allergy status to narcotic agent status: Secondary | ICD-10-CM | POA: Diagnosis not present

## 2021-06-28 ENCOUNTER — Ambulatory Visit (INDEPENDENT_AMBULATORY_CARE_PROVIDER_SITE_OTHER): Payer: BC Managed Care – PPO | Admitting: Family Medicine

## 2021-06-28 ENCOUNTER — Encounter: Payer: Self-pay | Admitting: Family Medicine

## 2021-06-28 ENCOUNTER — Other Ambulatory Visit: Payer: Self-pay

## 2021-06-28 VITALS — BP 154/84 | HR 72 | Temp 98.1°F | Resp 18

## 2021-06-28 DIAGNOSIS — I10 Essential (primary) hypertension: Secondary | ICD-10-CM | POA: Diagnosis not present

## 2021-06-28 DIAGNOSIS — E876 Hypokalemia: Secondary | ICD-10-CM

## 2021-06-28 DIAGNOSIS — I440 Atrioventricular block, first degree: Secondary | ICD-10-CM | POA: Diagnosis not present

## 2021-06-28 LAB — COMPLETE METABOLIC PANEL WITH GFR
AG Ratio: 1.6 (calc) (ref 1.0–2.5)
ALT: 29 U/L (ref 9–46)
AST: 22 U/L (ref 10–35)
Albumin: 4.3 g/dL (ref 3.6–5.1)
Alkaline phosphatase (APISO): 47 U/L (ref 35–144)
BUN: 16 mg/dL (ref 7–25)
CO2: 32 mmol/L (ref 20–32)
Calcium: 9.9 mg/dL (ref 8.6–10.3)
Chloride: 102 mmol/L (ref 98–110)
Creat: 1.1 mg/dL (ref 0.70–1.35)
Globulin: 2.7 g/dL (calc) (ref 1.9–3.7)
Glucose, Bld: 114 mg/dL — ABNORMAL HIGH (ref 65–99)
Potassium: 4.1 mmol/L (ref 3.5–5.3)
Sodium: 140 mmol/L (ref 135–146)
Total Bilirubin: 1.1 mg/dL (ref 0.2–1.2)
Total Protein: 7 g/dL (ref 6.1–8.1)
eGFR: 75 mL/min/{1.73_m2} (ref >=60)

## 2021-06-28 NOTE — Patient Instructions (Signed)
Rechecking potassium today. We will let you know results. If normal, we will continue the hydrochlorothiazide and recheck potassium and blood pressure again next week to make sure potassium is not starting to drop again.

## 2021-06-28 NOTE — Progress Notes (Signed)
Acute Office Visit  Subjective:    Patient ID: Lance Hernandez, male    DOB: 1956/08/03, 65 y.o.   MRN: [id number]  Chief Complaint  Patient presents with   Hospitalization Follow-up    HPI Patient is in today for ED follow-up regarding hypokalemia and BP.  Patient was recently in the emergency department on September 13 for " cramping" sensation to sternum and left arm along with some lightheadedness.  Negative cardiac work-up but potassium was low at 2.8.  For background, patient was started on HCTZ about a month ago and noticed significant improvement in blood pressures at home with an average of 120/80.  On September 7 he went to the ED for some chest tightness and was started on doxycycline for potential pneumonia.  He reports the doxycycline gave him diarrhea.  At this ED visit his potassium was 3.6.  On September 12 he went to a cardiology appointment for new first-degree AV block with no changes made to plan.  Patient reports when he has experienced regular diarrhea since starting doxycycline.   States he is feeling mostly better after having potassium repleted.  Last night he was doing some research and saw that HCTZ can cause hypokalemia since it is a diuretic.  He states the ED did not mention this to him and this has made him slightly worked up.   Today he denies any chest pain, shortness of breath, edema, lightheadedness, cramps.    Past Medical History:  Diagnosis Date   Colitis    DJD (degenerative joint disease)    First degree AV block    History of prostatitis    Hyperlipidemia    Hypertension    Scoliosis     Past Surgical History:  Procedure Laterality Date   CATARACT EXTRACTION, BILATERAL Bilateral    TONSILLECTOMY      Family History  Problem Relation Age of Onset   COPD Mother        Smoker   COPD Sister        Smoker    Social History   Socioeconomic History   Marital status: Married    Spouse name: susan    Number of children: 1   Years of  education: Not on file   Highest education level: Not on file  Occupational History   Occupation: Press photographer    Comment: Computer work  Tobacco Use   Smoking status: Never   Smokeless tobacco: Never  Substance and Sexual Activity   Alcohol use: Yes    Comment: occasional - wine on holidays   Drug use: No   Sexual activity: Yes    Partners: Female  Other Topics Concern   Not on file  Social History Narrative   Next caffeine. No regular exercise. He does plan on joining a gym. His wife is diabetic.   Social Determinants of Health   Financial Resource Strain: Not on file  Food Insecurity: Not on file  Transportation Needs: Not on file  Physical Activity: Not on file  Stress: Not on file  Social Connections: Not on file  Intimate Partner Violence: Not on file    Outpatient Medications Prior to Visit  Medication Sig Dispense Refill   acetaminophen (TYLENOL) 325 MG tablet Take 650 mg by mouth every 6 (six) hours as needed.     albuterol (VENTOLIN HFA) 108 (90 Base) MCG/ACT inhaler Inhale 2 puffs into the lungs every 6 (six) hours as needed for wheezing or shortness of breath. 8 g 2  atorvastatin (LIPITOR) 40 MG tablet TAKE 1 TABLET DAILY 90 tablet 3   azelastine (ASTELIN) 0.1 % nasal spray Place into the nose.     finasteride (PROSCAR) 5 MG tablet Take 1 tablet (5 mg total) by mouth daily. 90 tablet 3   fluticasone (FLONASE) 50 MCG/ACT nasal spray Place 2 sprays into both nostrils daily.     hydrochlorothiazide (MICROZIDE) 12.5 MG capsule Take 1 capsule (12.5 mg total) by mouth daily. 90 capsule 0   hydrocortisone (ANUSOL-HC) 2.5 % rectal cream Place 1 application rectally 2 (two) times daily. 30 g PRN   hydrocortisone (ANUSOL-HC) 25 MG suppository Place 1 suppository (25 mg total) rectally 2 (two) times daily. 12 suppository PRN   hydrOXYzine (ATARAX/VISTARIL) 50 MG tablet Take 0.5-1 tablets (25-50 mg total) by mouth 3 (three) times daily as needed for anxiety. 45 tablet 3    ibuprofen (ADVIL) 200 MG tablet Take 200 mg by mouth every 6 (six) hours as needed.     Lidocaine, Anorectal, 5 % CREA Apply 1 application topically 3 (three) times daily as needed. 45 g PRN   losartan (COZAAR) 100 MG tablet TAKE 1 TABLET DAILY 90 tablet 0   melatonin 5 MG TABS Take 5 mg by mouth at bedtime as needed.     multivitamin (ONE-A-DAY MEN'S) TABS tablet Take 1 tablet by mouth daily. 90 tablet 3   mupirocin ointment (BACTROBAN) 2 % SMARTSIG:Sparingly Both Nares Twice Daily     omeprazole (PRILOSEC) 20 MG capsule TAKE 1 CAPSULE DAILY 90 capsule 3   No facility-administered medications prior to visit.    Allergies  Allergen Reactions   Aspirin Other (See Comments)    "It's been decades ago. I don't remember"   Phentermine Other (See Comments)    Nausea   Prednisone Rash   Sulfa Antibiotics Rash   Tamsulosin Rash   Tramadol Rash    Review of Systems All review of systems negative except what is listed in the HPI     Objective:    Physical Exam Vitals reviewed.  Constitutional:      Appearance: Normal appearance.  HENT:     Head: Normocephalic and atraumatic.  Eyes:     Conjunctiva/sclera: Conjunctivae normal.     Pupils: Pupils are equal, round, and reactive to light.  Cardiovascular:     Rate and Rhythm: Normal rate and regular rhythm.  Pulmonary:     Effort: Pulmonary effort is normal.     Breath sounds: Normal breath sounds.  Musculoskeletal:     Right lower leg: No edema.     Left lower leg: No edema.  Neurological:     Mental Status: He is alert and oriented to person, place, and time.  Psychiatric:        Mood and Affect: Mood normal.        Thought Content: Thought content normal.        Judgment: Judgment normal.     Comments: Talking quickly, restless    BP (!) 154/84   Pulse 72   Temp 98.1 F (36.7 C)   Resp 18   SpO2 98%  Wt Readings from Last 3 Encounters:  06/20/21 238 lb 11.2 oz (108.3 kg)  05/30/21 240 lb 1.4 oz (108.9 kg)   07/05/20 223 lb (101.2 kg)    Health Maintenance Due  Topic Date Due   Pneumococcal Vaccine 17-45 Years old (2 - PCV) 06/18/2017   COVID-19 Vaccine (3 - Booster for Pfizer series) 06/27/2020    There  are no preventive care reminders to display for this patient.   Lab Results  Component Value Date   TSH 1.98 06/20/2021   Lab Results  Component Value Date   WBC 4.2 05/30/2021   HGB 16.0 05/30/2021   HCT 47.3 05/30/2021   MCV 92.9 05/30/2021   PLT 219 05/30/2021   Lab Results  Component Value Date   NA 141 05/30/2021   K 4.3 05/30/2021   CO2 29 05/30/2021   GLUCOSE 127 (H) 05/30/2021   BUN 15 05/30/2021   CREATININE 1.01 05/30/2021   BILITOT 1.5 (H) 05/30/2021   ALKPHOS 47 04/11/2017   AST 29 05/30/2021   ALT 39 05/30/2021   PROT 7.7 05/30/2021   ALBUMIN 4.6 04/11/2017   CALCIUM 10.1 05/30/2021   EGFR 84 05/30/2021   Lab Results  Component Value Date   CHOL 171 05/30/2021   Lab Results  Component Value Date   HDL 57 05/30/2021   Lab Results  Component Value Date   LDLCALC 90 05/30/2021   Lab Results  Component Value Date   TRIG 144 05/30/2021   Lab Results  Component Value Date   CHOLHDL 3.0 05/30/2021   Lab Results  Component Value Date   HGBA1C 5.8 (H) 05/30/2021       Assessment & Plan:   1. Essential hypertension, benign 2. Hypokalemia BP  elevated for the past 2 days but patient reports being "hyped up" over his hospitalization. Potassium has not yet been rechecked since the ED replaced IV/PO K. I would like to first know what his potassium is before making any changes. HCTZ had been doing really well controlling his home blood pressure. Unsure if hypokalemia was from HCTZ or ongoing diarrhea (from doxycycline) or a combination of both. Will let him know as soon as potassium is back regarding plan of either continuing HCTZ for another week trial and rechecking potassium, or making adjustments sooner. Patient agreeable to plan. Encouraged him  to try relaxation techniques and make sure he is resting for at least 10 minutes with legs uncrossed and arm at heart levels before checking BP at home.   - COMPLETE METABOLIC PANEL WITH GFR   Follow-up pending lab results.   Patient aware of signs/symptoms requiring further/urgent evaluation.    Purcell Nails Olevia Bowens, DNP, FNP-C    I spent 35 minutes dedicated to the care of this patient on the date of this encounter to include pre-visit chart review of prior notes and results, face-to-face time with the patient, and post-visit ordering of testing as indicated.

## 2021-06-29 NOTE — Addendum Note (Signed)
Addended by: Hyman Hopes B on: 06/29/2021 08:47 AM   Modules accepted: Orders

## 2021-07-05 DIAGNOSIS — I10 Essential (primary) hypertension: Secondary | ICD-10-CM | POA: Diagnosis not present

## 2021-07-05 DIAGNOSIS — E876 Hypokalemia: Secondary | ICD-10-CM | POA: Diagnosis not present

## 2021-07-06 LAB — BASIC METABOLIC PANEL
BUN: 14 mg/dL (ref 7–25)
CO2: 32 mmol/L (ref 20–32)
Calcium: 9.7 mg/dL (ref 8.6–10.3)
Chloride: 100 mmol/L (ref 98–110)
Creat: 1.05 mg/dL (ref 0.70–1.35)
Glucose, Bld: 116 mg/dL — ABNORMAL HIGH (ref 65–99)
Potassium: 4.3 mmol/L (ref 3.5–5.3)
Sodium: 141 mmol/L (ref 135–146)

## 2021-07-09 ENCOUNTER — Ambulatory Visit (INDEPENDENT_AMBULATORY_CARE_PROVIDER_SITE_OTHER): Payer: BC Managed Care – PPO | Admitting: Sports Medicine

## 2021-07-09 ENCOUNTER — Encounter: Payer: Self-pay | Admitting: Sports Medicine

## 2021-07-09 ENCOUNTER — Telehealth: Payer: Self-pay | Admitting: Cardiology

## 2021-07-09 ENCOUNTER — Ambulatory Visit (INDEPENDENT_AMBULATORY_CARE_PROVIDER_SITE_OTHER): Payer: BC Managed Care – PPO

## 2021-07-09 DIAGNOSIS — I440 Atrioventricular block, first degree: Secondary | ICD-10-CM

## 2021-07-09 DIAGNOSIS — I10 Essential (primary) hypertension: Secondary | ICD-10-CM | POA: Diagnosis not present

## 2021-07-09 MED ORDER — VALSARTAN 320 MG PO TABS: 320.0000 mg | ORAL_TABLET | Freq: Every day | ORAL | 3 refills | Status: DC

## 2021-07-09 NOTE — Telephone Encounter (Signed)
Routed to [facility] for assistance, as Dr Benjamin Stain ordered the monitor

## 2021-07-09 NOTE — Telephone Encounter (Signed)
Returned call to patient and answered all his questions. He knows to call me if he needs help applying his monitor.

## 2021-07-09 NOTE — Assessment & Plan Note (Signed)
History of first-degree AV block, sounds like he was referred to cardiology, he is also having episodes of presyncope, I am going to add some long-term monitoring.

## 2021-07-09 NOTE — Progress Notes (Signed)
    Procedures performed today:    None.  Independent interpretation of notes and tests performed by another provider:   None.  Brief History, Exam, Impression, and Recommendations:    Essential hypertension, benign This is a pleasant 65 year old male, he has been complaining of persistent dizziness that he describes as presyncope. He thinks it is because of his hydrochlorothiazide however his blood pressures at home have consistently been elevated, blood pressure is elevated today, he is adamant about stopping his hydrochlorothiazide so we will discontinue this, switching from losartan to valsartan high dose. I do think we need an echocardiogram, also considering his history of first-degree heart block I think we need long-term event monitoring. Recent renal function was normal. We will also do a home sleep study. Return to see [place] in 2 to 4 weeks.  First degree AV block History of first-degree AV block, sounds like he was referred to cardiology, he is also having episodes of presyncope, I am going to add some long-term monitoring.    ___________________________________________ Ihor Austin. Benjamin Stain, M.D., ABFM., CAQSM. Primary Care and Sports Medicine  [facility]  Adjunct Instructor of Family Medicine  [employer]

## 2021-07-09 NOTE — Assessment & Plan Note (Signed)
This is a pleasant 65 year old male, he has been complaining of persistent dizziness that he describes as presyncope. He thinks it is because of his hydrochlorothiazide however his blood pressures at home have consistently been elevated, blood pressure is elevated today, he is adamant about stopping his hydrochlorothiazide so we will discontinue this, switching from losartan to valsartan high dose. I do think we need an echocardiogram, also considering his history of first-degree heart block I think we need long-term event monitoring. Recent renal function was normal. We will also do a home sleep study. Return to see [place] in 2 to 4 weeks.

## 2021-07-09 NOTE — Telephone Encounter (Signed)
Lance Hernandez is calling requesting to speak with someone to confirm his heart monitor is completely covered by insurance. Please advise.

## 2021-07-09 NOTE — Progress Notes (Unsigned)
Enrolled patient for a 14 day Zio XT Monitor to be mailed to patients home  

## 2021-07-11 ENCOUNTER — Telehealth: Payer: Self-pay | Admitting: *Deleted

## 2021-07-11 NOTE — Telephone Encounter (Signed)
Pt called and lvm stating that after he was seen on Monday his hctz, and losartan was stopped. He started him on Valsartan 320 mg. He said that he took his bp this morning and it was 179/118  He doesn't understand why .

## 2021-07-26 ENCOUNTER — Other Ambulatory Visit: Payer: Self-pay | Admitting: Family Medicine

## 2021-07-30 ENCOUNTER — Ambulatory Visit (HOSPITAL_BASED_OUTPATIENT_CLINIC_OR_DEPARTMENT_OTHER): Payer: BC Managed Care – PPO

## 2021-07-31 DIAGNOSIS — I440 Atrioventricular block, first degree: Secondary | ICD-10-CM

## 2021-08-03 ENCOUNTER — Ambulatory Visit (HOSPITAL_BASED_OUTPATIENT_CLINIC_OR_DEPARTMENT_OTHER): Payer: BC Managed Care – PPO

## 2021-08-06 ENCOUNTER — Ambulatory Visit (INDEPENDENT_AMBULATORY_CARE_PROVIDER_SITE_OTHER): Payer: BC Managed Care – PPO | Admitting: Family Medicine

## 2021-08-06 ENCOUNTER — Other Ambulatory Visit: Payer: Self-pay

## 2021-08-06 ENCOUNTER — Encounter: Payer: Self-pay | Admitting: Family Medicine

## 2021-08-06 VITALS — BP 145/80 | HR 75 | Ht 66.0 in | Wt 239.0 lb

## 2021-08-06 DIAGNOSIS — I440 Atrioventricular block, first degree: Secondary | ICD-10-CM

## 2021-08-06 DIAGNOSIS — R1319 Other dysphagia: Secondary | ICD-10-CM | POA: Diagnosis not present

## 2021-08-06 DIAGNOSIS — R0789 Other chest pain: Secondary | ICD-10-CM | POA: Diagnosis not present

## 2021-08-06 DIAGNOSIS — R7301 Impaired fasting glucose: Secondary | ICD-10-CM

## 2021-08-06 DIAGNOSIS — I10 Essential (primary) hypertension: Secondary | ICD-10-CM | POA: Diagnosis not present

## 2021-08-06 DIAGNOSIS — F411 Generalized anxiety disorder: Secondary | ICD-10-CM

## 2021-08-06 DIAGNOSIS — E669 Obesity, unspecified: Secondary | ICD-10-CM

## 2021-08-06 DIAGNOSIS — K219 Gastro-esophageal reflux disease without esophagitis: Secondary | ICD-10-CM | POA: Diagnosis not present

## 2021-08-06 MED ORDER — AMLODIPINE BESYLATE 2.5 MG PO TABS: 2.5000 mg | ORAL_TABLET | Freq: Every day | ORAL | 0 refills | Status: DC

## 2021-08-06 NOTE — Assessment & Plan Note (Signed)
   Lab Results  Component Value Date   HGBA1C 5.8 (H) 05/30/2021

## 2021-08-06 NOTE — Assessment & Plan Note (Signed)
Does feel like his anxiety does contribute to some of the issues.  We did discuss possibly considering medication at some point if he feels like that would be helpful for him.  He declined today.

## 2021-08-06 NOTE — Assessment & Plan Note (Signed)
We will move forward with echocardiogram and see if we can get it scheduled at [facility] which I believe is in network for him.  We will go ahead and work on that.

## 2021-08-06 NOTE — Progress Notes (Signed)
Established Patient Office Visit  Subjective:  Patient ID: Lance Hernandez, male    DOB: 11-10-1955  Age: 64 y.o. MRN: [id number]  CC:  Chief Complaint  Patient presents with   Hypertension    HPI Lance Hernandez presents for   Hypertension- Pt denies chest pain, SOB, dizziness, or heart palpitations.  Taking meds as directed w/o problems.  Denies medication side effects.  Reports home blood pressures in the 150s over low 100s in the mornings but in the evenings he has been getting somewhat better blood pressures in the 120s.  Dizziness -he was seen by one of my partners about 4 weeks ago for persistent dizziness he felt like it was from the HCTZ component of his blood pressure pill.  Blood pressure was quite elevated the day that he came in to be seen it was discontinued and he was switched from losartan to valsartan hand.  He was also referred for cardiac monitor and an echocardiogram.   Feels like the chest pressure and pain that he was having before is better since coming off the HCTZ but still having some intermittent dizziness.  He also reports he has not been sleeping well he is only been getting about 4 to 5 hours of sleep it started after he started getting chronic ear ringing in his right ear.  Past Medical History:  Diagnosis Date   Colitis    DJD (degenerative joint disease)    First degree AV block    History of prostatitis    Hyperlipidemia    Hypertension    Scoliosis     Past Surgical History:  Procedure Laterality Date   CATARACT EXTRACTION, BILATERAL Bilateral    TONSILLECTOMY      Family History  Problem Relation Age of Onset   COPD Mother        Smoker   COPD Sister        Smoker    Social History   Socioeconomic History   Marital status: Married    Spouse name: susan    Number of children: 1   Years of education: Not on file   Highest education level: Not on file  Occupational History   Occupation: Press photographer    Comment: Computer work  Tobacco Use    Smoking status: Never   Smokeless tobacco: Never  Substance and Sexual Activity   Alcohol use: Yes    Comment: occasional - wine on holidays   Drug use: No   Sexual activity: Yes    Partners: Female  Other Topics Concern   Not on file  Social History Narrative   Next caffeine. No regular exercise. He does plan on joining a gym. His wife is diabetic.   Social Determinants of Health   Financial Resource Strain: Not on file  Food Insecurity: Not on file  Transportation Needs: Not on file  Physical Activity: Not on file  Stress: Not on file  Social Connections: Not on file  Intimate Partner Violence: Not on file    Outpatient Medications Prior to Visit  Medication Sig Dispense Refill   acetaminophen (TYLENOL) 325 MG tablet Take 650 mg by mouth every 6 (six) hours as needed.     albuterol (VENTOLIN HFA) 108 (90 Base) MCG/ACT inhaler Inhale 2 puffs into the lungs every 6 (six) hours as needed for wheezing or shortness of breath. 8 g 2   atorvastatin (LIPITOR) 40 MG tablet TAKE 1 TABLET DAILY 90 tablet 3   finasteride (PROSCAR) 5 MG tablet TAKE 1  TABLET DAILY 90 tablet 3   fluticasone (FLONASE) 50 MCG/ACT nasal spray Place 2 sprays into both nostrils daily.     hydrocortisone (ANUSOL-HC) 2.5 % rectal cream Place 1 application rectally 2 (two) times daily. 30 g PRN   hydrocortisone (ANUSOL-HC) 25 MG suppository Place 1 suppository (25 mg total) rectally 2 (two) times daily. 12 suppository PRN   hydrOXYzine (ATARAX/VISTARIL) 50 MG tablet Take 0.5-1 tablets (25-50 mg total) by mouth 3 (three) times daily as needed for anxiety. 45 tablet 3   ibuprofen (ADVIL) 200 MG tablet Take 200 mg by mouth every 6 (six) hours as needed.     Lidocaine, Anorectal, 5 % CREA Apply 1 application topically 3 (three) times daily as needed. 45 g PRN   melatonin 5 MG TABS Take 5 mg by mouth at bedtime as needed.     multivitamin (ONE-A-DAY MEN'S) TABS tablet Take 1 tablet by mouth daily. 90 tablet 3   mupirocin  ointment (BACTROBAN) 2 % SMARTSIG:Sparingly Both Nares Twice Daily     omeprazole (PRILOSEC) 20 MG capsule TAKE 1 CAPSULE DAILY 90 capsule 3   valsartan (DIOVAN) 320 MG tablet Take 1 tablet (320 mg total) by mouth daily. 90 tablet 3   No facility-administered medications prior to visit.    Allergies  Allergen Reactions   Aspirin Other (See Comments)    "It's been decades ago. I don't remember"   Phentermine Other (See Comments)    Nausea   Prednisone Rash   Sulfa Antibiotics Rash   Tamsulosin Rash   Tramadol Rash    ROS Review of Systems    Objective:    Physical Exam Constitutional:      Appearance: Normal appearance. He is well-developed.  HENT:     Head: Normocephalic and atraumatic.  Cardiovascular:     Rate and Rhythm: Normal rate and regular rhythm.     Heart sounds: Normal heart sounds.  Pulmonary:     Effort: Pulmonary effort is normal.     Breath sounds: Normal breath sounds.  Skin:    General: Skin is warm and dry.  Neurological:     Mental Status: He is alert and oriented to person, place, and time. Mental status is at baseline.  Psychiatric:        Behavior: Behavior normal.    BP (!) 145/80   Pulse 75   Ht '5\' 6"'  (1.676 m)   Wt 239 lb (108.4 kg)   SpO2 97%   BMI 38.58 kg/m  Wt Readings from Last 3 Encounters:  08/06/21 239 lb (108.4 kg)  07/09/21 238 lb (108 kg)  06/20/21 238 lb 11.2 oz (108.3 kg)     Health Maintenance Due  Topic Date Due   COVID-19 Vaccine (3 - Booster for Pfizer series) 03/22/2020    There are no preventive care reminders to display for this patient.  Lab Results  Component Value Date   TSH 1.98 06/20/2021   Lab Results  Component Value Date   WBC 4.2 05/30/2021   HGB 16.0 05/30/2021   HCT 47.3 05/30/2021   MCV 92.9 05/30/2021   PLT 219 05/30/2021   Lab Results  Component Value Date   NA 141 07/05/2021   K 4.3 07/05/2021   CO2 32 07/05/2021   GLUCOSE 116 (H) 07/05/2021   BUN 14 07/05/2021   CREATININE  1.05 07/05/2021   BILITOT 1.1 06/28/2021   ALKPHOS 47 04/11/2017   AST 22 06/28/2021   ALT 29 06/28/2021   PROT 7.0 06/28/2021  ALBUMIN 4.6 04/11/2017   CALCIUM 9.7 07/05/2021   EGFR 75 06/28/2021   Lab Results  Component Value Date   CHOL 171 05/30/2021   Lab Results  Component Value Date   HDL 57 05/30/2021   Lab Results  Component Value Date   LDLCALC 90 05/30/2021   Lab Results  Component Value Date   TRIG 144 05/30/2021   Lab Results  Component Value Date   CHOLHDL 3.0 05/30/2021   Lab Results  Component Value Date   HGBA1C 5.8 (H) 05/30/2021      Assessment & Plan:   Problem List Items Addressed This Visit       Cardiovascular and Mediastinum   First degree AV block    We will move forward with echocardiogram and see if we can get it scheduled at [facility] which I believe is in network for him.  We will go ahead and work on that.      Relevant Medications   amLODipine (NORVASC) 2.5 MG tablet   Other Relevant Orders   ECHOCARDIOGRAM COMPLETE BUBBLE STUDY   Essential hypertension, benign - Primary    Blood pressure does look a little better today.  In fact is a little better than what he is actually getting at home.  We discussed maybe adding a second medication to his current regimen for better control we will continue with the valsartan and hold off on the HCTZ since it does not like he was getting a little bit of chest discomfort or pressure with that medication.  He would like to have his potassium checked.  Also like to go ahead and move forward with home sleep study to rule out causes for uncontrolled hypertension as well.      Relevant Medications   amLODipine (NORVASC) 2.5 MG tablet   Other Relevant Orders   BASIC METABOLIC PANEL WITH GFR   ECHOCARDIOGRAM COMPLETE BUBBLE STUDY     Endocrine   IFG (impaired fasting glucose)      Lab Results  Component Value Date   HGBA1C 5.8 (H) 05/30/2021           Other   Obesity, Class II, BMI  35-39.9   GAD (generalized anxiety disorder)    Does feel like his anxiety does contribute to some of the issues.  We did discuss possibly considering medication at some point if he feels like that would be helpful for him.  He declined today.      Other Visit Diagnoses     Atypical chest pain       Relevant Orders   ECHOCARDIOGRAM COMPLETE BUBBLE STUDY   Uncontrolled hypertension       Relevant Medications   amLODipine (NORVASC) 2.5 MG tablet   Other Relevant Orders   Home sleep test       Meds ordered this encounter  Medications   amLODipine (NORVASC) 2.5 MG tablet    Sig: Take 1 tablet (2.5 mg total) by mouth daily.    Dispense:  90 tablet    Refill:  0     Follow-up: Return in about 7 weeks (around 09/24/2021) for Hypertension.    Beatrice Lecher, MD

## 2021-08-06 NOTE — Assessment & Plan Note (Addendum)
Blood pressure does look a little better today.  In fact is a little better than what he is actually getting at home.  We discussed maybe adding a second medication to his current regimen for better control we will continue with the valsartan and hold off on the HCTZ since it does not like he was getting a little bit of chest discomfort or pressure with that medication.  He would like to have his potassium checked.  Also like to go ahead and move forward with home sleep study to rule out causes for uncontrolled hypertension as well.

## 2021-08-07 LAB — BASIC METABOLIC PANEL WITH GFR
BUN: 15 mg/dL (ref 7–25)
CO2: 30 mmol/L (ref 20–32)
Calcium: 9.7 mg/dL (ref 8.6–10.3)
Chloride: 102 mmol/L (ref 98–110)
Creat: 0.94 mg/dL (ref 0.70–1.35)
Glucose, Bld: 117 mg/dL — ABNORMAL HIGH (ref 65–99)
Potassium: 4 mmol/L (ref 3.5–5.3)
Sodium: 141 mmol/L (ref 135–146)
eGFR: 91 mL/min/{1.73_m2} (ref >=60)

## 2021-08-07 NOTE — Addendum Note (Signed)
Addended by: Juel Burrow on: 08/07/2021 08:48 AM   Modules accepted: Orders

## 2021-08-07 NOTE — Progress Notes (Signed)
Your lab work is within acceptable range and there are no concerning findings.   ?

## 2021-08-10 DIAGNOSIS — H0015 Chalazion left lower eyelid: Secondary | ICD-10-CM | POA: Diagnosis not present

## 2021-08-15 ENCOUNTER — Ambulatory Visit: Payer: BC Managed Care – PPO | Admitting: Cardiology

## 2021-08-15 DIAGNOSIS — R0683 Snoring: Secondary | ICD-10-CM | POA: Diagnosis not present

## 2021-08-15 DIAGNOSIS — J449 Chronic obstructive pulmonary disease, unspecified: Secondary | ICD-10-CM | POA: Diagnosis not present

## 2021-08-15 DIAGNOSIS — R29818 Other symptoms and signs involving the nervous system: Secondary | ICD-10-CM | POA: Diagnosis not present

## 2021-08-15 DIAGNOSIS — I10 Essential (primary) hypertension: Secondary | ICD-10-CM | POA: Diagnosis not present

## 2021-08-17 DIAGNOSIS — I440 Atrioventricular block, first degree: Secondary | ICD-10-CM | POA: Diagnosis not present

## 2021-08-17 DIAGNOSIS — R0789 Other chest pain: Secondary | ICD-10-CM | POA: Diagnosis not present

## 2021-08-17 DIAGNOSIS — I10 Essential (primary) hypertension: Secondary | ICD-10-CM | POA: Diagnosis not present

## 2021-08-29 ENCOUNTER — Telehealth: Payer: Self-pay

## 2021-08-29 ENCOUNTER — Telehealth: Payer: Self-pay | Admitting: Family Medicine

## 2021-08-29 DIAGNOSIS — J449 Chronic obstructive pulmonary disease, unspecified: Secondary | ICD-10-CM | POA: Diagnosis not present

## 2021-08-29 DIAGNOSIS — R29818 Other symptoms and signs involving the nervous system: Secondary | ICD-10-CM | POA: Diagnosis not present

## 2021-08-29 DIAGNOSIS — R0902 Hypoxemia: Secondary | ICD-10-CM | POA: Diagnosis not present

## 2021-08-29 DIAGNOSIS — I10 Essential (primary) hypertension: Secondary | ICD-10-CM | POA: Diagnosis not present

## 2021-08-29 DIAGNOSIS — R0683 Snoring: Secondary | ICD-10-CM | POA: Diagnosis not present

## 2021-08-29 DIAGNOSIS — G4733 Obstructive sleep apnea (adult) (pediatric): Secondary | ICD-10-CM | POA: Diagnosis not present

## 2021-08-29 NOTE — Telephone Encounter (Signed)
Spoke with patient regarding ECHO results and he inquired about his results for his Long Term monitor. He reports still having the lightheadedness. Please advise.

## 2021-08-29 NOTE — Telephone Encounter (Signed)
Please call patient and let him know that we did receive his echocardiogram results.  The left ventricle looked good.  Pumping function was okay.  The chambers look normal.  The valves overall look good.  There is just a little bit of backflow on the mitral valve and tricuspid valve.  This is not concerning or worrisome at this point.  And there was no fluid around the heart.  So overall a good report.

## 2021-08-29 NOTE — Telephone Encounter (Signed)
Patient aware of results and verbalized understanding.

## 2021-08-30 NOTE — Telephone Encounter (Signed)
Pt informed of need to f/u with cardiologist.  Pt was provided with the MDs name and phone number to call and schedule an appointment.  Tiajuana Amass, CMA

## 2021-08-30 NOTE — Telephone Encounter (Signed)
LONG TERM MONITOR (3-14 DAYS): Result Notes  Juel Burrow, CMA  08/24/2021 10:18 AM EST     Seen by patient Luiz Iron on 08/24/2021 10:13 AM   Monica Becton, MD  08/24/2021  9:51 AM EST     Rare PACs and PVCs which can cause palpitations, nothing that would obviously cause syncope, keep follow-up with cardiology.

## 2021-08-31 DIAGNOSIS — R1310 Dysphagia, unspecified: Secondary | ICD-10-CM | POA: Diagnosis not present

## 2021-08-31 DIAGNOSIS — K219 Gastro-esophageal reflux disease without esophagitis: Secondary | ICD-10-CM | POA: Diagnosis not present

## 2021-09-24 ENCOUNTER — Other Ambulatory Visit: Payer: Self-pay

## 2021-09-24 ENCOUNTER — Ambulatory Visit (INDEPENDENT_AMBULATORY_CARE_PROVIDER_SITE_OTHER): Payer: BC Managed Care – PPO | Admitting: Family Medicine

## 2021-09-24 ENCOUNTER — Encounter: Payer: Self-pay | Admitting: Family Medicine

## 2021-09-24 VITALS — BP 142/69 | HR 74 | Ht 66.0 in | Wt 232.0 lb

## 2021-09-24 DIAGNOSIS — R7301 Impaired fasting glucose: Secondary | ICD-10-CM

## 2021-09-24 DIAGNOSIS — G4733 Obstructive sleep apnea (adult) (pediatric): Secondary | ICD-10-CM | POA: Diagnosis not present

## 2021-09-24 DIAGNOSIS — R0981 Nasal congestion: Secondary | ICD-10-CM

## 2021-09-24 DIAGNOSIS — I10 Essential (primary) hypertension: Secondary | ICD-10-CM | POA: Diagnosis not present

## 2021-09-24 MED ORDER — IPRATROPIUM BROMIDE 0.06 % NA SOLN: 2.0000 | Freq: Three times a day (TID) | NASAL | 5 refills | Status: DC

## 2021-09-24 MED ORDER — AMLODIPINE BESYLATE 5 MG PO TABS: 5.0000 mg | ORAL_TABLET | Freq: Every day | ORAL | 1 refills | Status: DC

## 2021-09-24 NOTE — Assessment & Plan Note (Signed)
Last A1c in August was 5.8.  We will plan to recheck at his follow-up in 6 months.  He has lost about 5 pounds since he was last here so just really encouraged him to continue to work on making healthy food choices and trying to increase his activity level.  Lab Results  Component Value Date   HGBA1C 5.8 (H) 05/30/2021

## 2021-09-24 NOTE — Assessment & Plan Note (Signed)
Blood pressure does look a little bit better today.  Just encouraged him to continue to work on lifestyle changes and to pick small things that he feels like he could really work on.  In the meantime we will go ahead and increase amlodipine to 5 mg and he can monitor blood pressure at home which she has been doing pretty regularly.  Follow-up in 6 months.

## 2021-09-24 NOTE — Assessment & Plan Note (Addendum)
Study does confirm that he has sleep apnea but says there is no way he could wear his CPAP he is considering an oral appliance but not at this point in time.  He said he would rather die than treat it.  Did discuss some of the implications of not treating obstructive sleep apnea.

## 2021-09-24 NOTE — Progress Notes (Signed)
Established Patient Office Visit  Subjective:  Patient ID: Lance Hernandez, male    DOB: 07-Jun-1956  Age: 65 y.o. MRN: [id number]  CC:  Chief Complaint  Patient presents with   Hypertension    HPI [name] presents for   Hypertension- Pt denies chest pain, SOB, dizziness, or heart palpitations.  Taking meds as directed w/o problems.  Denies medication side effects.  Checking pressure at home.  He says about how the numbers are in the green zone and then maybe half or in the yellow zone.  Has been trying to walk at least once a week and trying to track his steps.  He recently had EGD with [facility] on November 18 for esophageal dysphagia.  Dizziness is better.  Cardiac monitor showed some PACs and PVCs.  Echocardiogram was normal.  Would also like to have something for his chronic nasal congestion its always on the right side.  He really needs sinus surgery but is trying to wait until he gets on Medicare next year.  He has Flonase at home but says he does not feel like it really works.  He has a deviated nasal septum and hypertrophy of the turbinates.  Also had a home sleep test performed since I last saw him.  Performed through [facility] on November 22  Past Medical History:  Diagnosis Date   Colitis    DJD (degenerative joint disease)    First degree AV block    History of prostatitis    Hyperlipidemia    Hypertension    Scoliosis     Past Surgical History:  Procedure Laterality Date   CATARACT EXTRACTION, BILATERAL Bilateral    TONSILLECTOMY      Family History  Problem Relation Age of Onset   COPD Mother        Smoker   COPD Sister        Smoker    Social History   Socioeconomic History   Marital status: Married    Spouse name: susan    Number of children: 1   Years of education: Not on file   Highest education level: Not on file  Occupational History   Occupation: Press photographer    Comment: Computer work  Tobacco Use   Smoking status: Never   Smokeless tobacco:  Never  Substance and Sexual Activity   Alcohol use: Yes    Comment: occasional - wine on holidays   Drug use: No   Sexual activity: Yes    Partners: Female  Other Topics Concern   Not on file  Social History Narrative   Next caffeine. No regular exercise. He does plan on joining a gym. His wife is diabetic.   Social Determinants of Health   Financial Resource Strain: Not on file  Food Insecurity: Not on file  Transportation Needs: Not on file  Physical Activity: Not on file  Stress: Not on file  Social Connections: Not on file  Intimate Partner Violence: Not on file    Outpatient Medications Prior to Visit  Medication Sig Dispense Refill   acetaminophen (TYLENOL) 325 MG tablet Take 650 mg by mouth every 6 (six) hours as needed.     albuterol (VENTOLIN HFA) 108 (90 Base) MCG/ACT inhaler Inhale 2 puffs into the lungs every 6 (six) hours as needed for wheezing or shortness of breath. 8 g 2   atorvastatin (LIPITOR) 40 MG tablet TAKE 1 TABLET DAILY 90 tablet 3   finasteride (PROSCAR) 5 MG tablet TAKE 1 TABLET DAILY 90  tablet 3   fluticasone (FLONASE) 50 MCG/ACT nasal spray Place 2 sprays into both nostrils daily.     hydrocortisone (ANUSOL-HC) 2.5 % rectal cream Place 1 application rectally 2 (two) times daily. 30 g PRN   hydrocortisone (ANUSOL-HC) 25 MG suppository Place 1 suppository (25 mg total) rectally 2 (two) times daily. 12 suppository PRN   hydrOXYzine (ATARAX/VISTARIL) 50 MG tablet Take 0.5-1 tablets (25-50 mg total) by mouth 3 (three) times daily as needed for anxiety. 45 tablet 3   ibuprofen (ADVIL) 200 MG tablet Take 200 mg by mouth every 6 (six) hours as needed.     Lidocaine, Anorectal, 5 % CREA Apply 1 application topically 3 (three) times daily as needed. 45 g PRN   melatonin 5 MG TABS Take 5 mg by mouth at bedtime as needed.     multivitamin (ONE-A-DAY MEN'S) TABS tablet Take 1 tablet by mouth daily. 90 tablet 3   omeprazole (PRILOSEC) 20 MG capsule TAKE 1 CAPSULE  DAILY 90 capsule 3   valsartan (DIOVAN) 320 MG tablet Take 1 tablet (320 mg total) by mouth daily. 90 tablet 3   amLODipine (NORVASC) 2.5 MG tablet Take 1 tablet (2.5 mg total) by mouth daily. 90 tablet 0   mupirocin ointment (BACTROBAN) 2 % SMARTSIG:Sparingly Both Nares Twice Daily     No facility-administered medications prior to visit.    Allergies  Allergen Reactions   Aspirin Other (See Comments)    "It's been decades ago. I don't remember"   Phentermine Other (See Comments)    Nausea   Prednisone Rash   Sulfa Antibiotics Rash   Tamsulosin Rash   Tramadol Rash    ROS Review of Systems    Objective:    Physical Exam Constitutional:      Appearance: Normal appearance. He is well-developed.  HENT:     Head: Normocephalic and atraumatic.  Cardiovascular:     Rate and Rhythm: Normal rate and regular rhythm.     Heart sounds: Normal heart sounds.  Pulmonary:     Effort: Pulmonary effort is normal.     Breath sounds: Normal breath sounds.  Skin:    General: Skin is warm and dry.  Neurological:     Mental Status: He is alert and oriented to person, place, and time. Mental status is at baseline.  Psychiatric:        Behavior: Behavior normal.    BP (!) 142/69   Pulse 74   Ht '5\' 6"'  (1.676 m)   Wt 232 lb (105.2 kg)   SpO2 98%   BMI 37.45 kg/m  Wt Readings from Last 3 Encounters:  09/24/21 232 lb (105.2 kg)  08/06/21 239 lb (108.4 kg)  07/09/21 238 lb (108 kg)     Health Maintenance Due  Topic Date Due   COVID-19 Vaccine (3 - Booster for Pfizer series) 03/22/2020    There are no preventive care reminders to display for this patient.  Lab Results  Component Value Date   TSH 1.98 06/20/2021   Lab Results  Component Value Date   WBC 4.2 05/30/2021   HGB 16.0 05/30/2021   HCT 47.3 05/30/2021   MCV 92.9 05/30/2021   PLT 219 05/30/2021   Lab Results  Component Value Date   NA 141 08/06/2021   K 4.0 08/06/2021   CO2 30 08/06/2021   GLUCOSE 117 (H)  08/06/2021   BUN 15 08/06/2021   CREATININE 0.94 08/06/2021   BILITOT 1.1 06/28/2021   ALKPHOS 47 04/11/2017  AST 22 06/28/2021   ALT 29 06/28/2021   PROT 7.0 06/28/2021   ALBUMIN 4.6 04/11/2017   CALCIUM 9.7 08/06/2021   EGFR 91 08/06/2021   Lab Results  Component Value Date   CHOL 171 05/30/2021   Lab Results  Component Value Date   HDL 57 05/30/2021   Lab Results  Component Value Date   LDLCALC 90 05/30/2021   Lab Results  Component Value Date   TRIG 144 05/30/2021   Lab Results  Component Value Date   CHOLHDL 3.0 05/30/2021   Lab Results  Component Value Date   HGBA1C 5.8 (H) 05/30/2021      Assessment & Plan:   Problem List Items Addressed This Visit       Cardiovascular and Mediastinum   Essential hypertension, benign - Primary    Blood pressure does look a little bit better today.  Just encouraged him to continue to work on lifestyle changes and to pick small things that he feels like he could really work on.  In the meantime we will go ahead and increase amlodipine to 5 mg and he can monitor blood pressure at home which she has been doing pretty regularly.  Follow-up in 6 months.      Relevant Medications   amLODipine (NORVASC) 5 MG tablet     Respiratory   OSA (obstructive sleep apnea)    Study does confirm that he has sleep apnea but says there is no way he could wear his CPAP he is considering an oral appliance but not at this point in time.  He said he would rather die than treat it.  Did discuss some of the implications of not treating obstructive sleep apnea.        Endocrine   IFG (impaired fasting glucose)    Last A1c in August was 5.8.  We will plan to recheck at his follow-up in 6 months.  He has lost about 5 pounds since he was last here so just really encouraged him to continue to work on making healthy food choices and trying to increase his activity level.  Lab Results  Component Value Date   HGBA1C 5.8 (H) 05/30/2021          Other Visit Diagnoses     Chronic nasal congestion       Relevant Medications   ipratropium (ATROVENT) 0.06 % nasal spray      Nasal congestion-we will try Atrovent nasal spray I do not think he is ever tried that 1 week and see if he feels like it is helpful.  Meds ordered this encounter  Medications   amLODipine (NORVASC) 5 MG tablet    Sig: Take 1 tablet (5 mg total) by mouth daily.    Dispense:  90 tablet    Refill:  1   ipratropium (ATROVENT) 0.06 % nasal spray    Sig: Place 2 sprays into both nostrils 3 (three) times daily.    Dispense:  15 mL    Refill:  5     Follow-up: Return in about 6 months (around 03/25/2022) for Hypertension.    Beatrice Lecher, MD

## 2021-09-26 ENCOUNTER — Other Ambulatory Visit: Payer: Self-pay | Admitting: *Deleted

## 2021-09-26 DIAGNOSIS — L719 Rosacea, unspecified: Secondary | ICD-10-CM

## 2021-09-26 MED ORDER — METRONIDAZOLE 0.75 % EX CREA: TOPICAL_CREAM | Freq: Two times a day (BID) | CUTANEOUS | 3 refills | Status: DC

## 2021-10-08 ENCOUNTER — Other Ambulatory Visit: Payer: Self-pay | Admitting: Family Medicine

## 2021-10-11 ENCOUNTER — Other Ambulatory Visit: Payer: Self-pay | Admitting: Family Medicine

## 2021-10-11 DIAGNOSIS — F419 Anxiety disorder, unspecified: Secondary | ICD-10-CM

## 2021-10-24 ENCOUNTER — Other Ambulatory Visit: Payer: Self-pay

## 2021-10-24 DIAGNOSIS — N401 Enlarged prostate with lower urinary tract symptoms: Secondary | ICD-10-CM

## 2021-10-24 DIAGNOSIS — F419 Anxiety disorder, unspecified: Secondary | ICD-10-CM

## 2021-10-24 DIAGNOSIS — K219 Gastro-esophageal reflux disease without esophagitis: Secondary | ICD-10-CM

## 2021-10-24 DIAGNOSIS — J449 Chronic obstructive pulmonary disease, unspecified: Secondary | ICD-10-CM

## 2021-10-24 DIAGNOSIS — E785 Hyperlipidemia, unspecified: Secondary | ICD-10-CM

## 2021-10-24 DIAGNOSIS — I10 Essential (primary) hypertension: Secondary | ICD-10-CM

## 2021-10-24 MED ORDER — VALSARTAN 320 MG PO TABS: 320.0000 mg | ORAL_TABLET | Freq: Every day | ORAL | 3 refills | Status: DC

## 2021-10-24 MED ORDER — FINASTERIDE 5 MG PO TABS: 5.0000 mg | ORAL_TABLET | Freq: Every day | ORAL | 3 refills | Status: DC

## 2021-10-24 MED ORDER — ALBUTEROL SULFATE HFA 108 (90 BASE) MCG/ACT IN AERS: INHALATION_SPRAY | RESPIRATORY_TRACT | 3 refills | Status: DC

## 2021-10-24 MED ORDER — ATORVASTATIN CALCIUM 40 MG PO TABS: 40.0000 mg | ORAL_TABLET | Freq: Every day | ORAL | 3 refills | Status: DC

## 2021-10-24 MED ORDER — AMLODIPINE BESYLATE 5 MG PO TABS: 5.0000 mg | ORAL_TABLET | Freq: Every day | ORAL | 1 refills | Status: DC

## 2021-10-24 MED ORDER — HYDROXYZINE HCL 50 MG PO TABS: ORAL_TABLET | ORAL | 1 refills | Status: DC

## 2021-10-24 MED ORDER — OMEPRAZOLE 20 MG PO CPDR: 20.0000 mg | DELAYED_RELEASE_CAPSULE | Freq: Every day | ORAL | 3 refills | Status: DC

## 2021-10-24 NOTE — Telephone Encounter (Signed)
Patient called requesting for refills on all medications to be sent to [facility] mail order. Prescriptions sent.

## 2022-01-28 ENCOUNTER — Other Ambulatory Visit: Payer: Self-pay | Admitting: *Deleted

## 2022-01-28 MED ORDER — HYDROCHLOROTHIAZIDE 12.5 MG PO CAPS: 12.5000 mg | ORAL_CAPSULE | Freq: Every day | ORAL | 1 refills | Status: DC

## 2022-01-29 ENCOUNTER — Other Ambulatory Visit: Payer: Self-pay | Admitting: Family Medicine

## 2022-01-29 NOTE — Telephone Encounter (Signed)
Please review this. Pt has been taking for the past 3 months.  ?

## 2022-03-28 ENCOUNTER — Encounter: Payer: Self-pay | Admitting: Family Medicine

## 2022-03-28 ENCOUNTER — Ambulatory Visit (INDEPENDENT_AMBULATORY_CARE_PROVIDER_SITE_OTHER): Payer: BC Managed Care – PPO | Admitting: Family Medicine

## 2022-03-28 VITALS — BP 128/80 | HR 88 | Ht 66.0 in | Wt 236.0 lb

## 2022-03-28 DIAGNOSIS — R7301 Impaired fasting glucose: Secondary | ICD-10-CM

## 2022-03-28 DIAGNOSIS — I10 Essential (primary) hypertension: Secondary | ICD-10-CM

## 2022-03-28 DIAGNOSIS — F419 Anxiety disorder, unspecified: Secondary | ICD-10-CM

## 2022-03-28 DIAGNOSIS — J449 Chronic obstructive pulmonary disease, unspecified: Secondary | ICD-10-CM

## 2022-03-28 DIAGNOSIS — F411 Generalized anxiety disorder: Secondary | ICD-10-CM

## 2022-03-28 DIAGNOSIS — H9311 Tinnitus, right ear: Secondary | ICD-10-CM

## 2022-03-28 LAB — POCT GLYCOSYLATED HEMOGLOBIN (HGB A1C): Hemoglobin A1C: 5.6 % (ref 4.0–5.6)

## 2022-03-28 MED ORDER — HYDROXYZINE HCL 50 MG PO TABS: 50.0000 mg | ORAL_TABLET | Freq: Every evening | ORAL | 3 refills | Status: DC | PRN

## 2022-03-28 MED ORDER — AMLODIPINE BESYLATE 5 MG PO TABS: 5.0000 mg | ORAL_TABLET | Freq: Every day | ORAL | 1 refills | Status: DC

## 2022-03-28 MED ORDER — HYDROCHLOROTHIAZIDE 12.5 MG PO CAPS: 12.5000 mg | ORAL_CAPSULE | Freq: Every day | ORAL | 1 refills | Status: DC

## 2022-03-28 NOTE — Assessment & Plan Note (Signed)
Well controlled. Continue current regimen. Follow up in  6 mo  

## 2022-03-28 NOTE — Progress Notes (Signed)
Pt wanted prescription for Hydroxyzine sent to [facility]. Called [facility] and had this cancelled spoke w/Ayanna.

## 2022-03-28 NOTE — Progress Notes (Signed)
Established Patient Office Visit  Subjective   Patient ID: Lance Hernandez, male    DOB: October 25, 1955  Age: 66 y.o. MRN: [id number]  Chief Complaint  Patient presents with   Hypertension    HPI  Hypertension- Pt denies chest pain, SOB, dizziness, or heart palpitations.  Taking meds as directed w/o problems.  Denies medication side effects.  Getting good BPs at home.  Home BPs running 120s/80s.  Impaired fasting glucose-no increased thirst or urination. No symptoms consistent with hypoglycemia.  COPD - has been using guaifenesin occ and wanted to make sure safe to take  Still struggling with his weight.  Feels frustrated but not really to really make major dietary changes and having difficulty with increasing exercise.  Years ago was really able to increase exercise and lose weight.    C/O chronic right sided tinnitus. Wanted to know if taking Flavanoids or supplement will help.     ROS    Objective:     BP 128/80   Pulse 88   Ht 5\' 6"  (1.676 m)   Wt 236 lb (107 kg)   SpO2 97%   BMI 38.09 kg/m    Physical Exam Constitutional:      Appearance: He is well-developed.  HENT:     Head: Normocephalic and atraumatic.  Cardiovascular:     Rate and Rhythm: Normal rate and regular rhythm.     Heart sounds: Normal heart sounds.  Pulmonary:     Effort: Pulmonary effort is normal.     Breath sounds: Normal breath sounds.  Skin:    General: Skin is warm and dry.  Neurological:     Mental Status: He is alert and oriented to person, place, and time.  Psychiatric:        Behavior: Behavior normal.      Results for orders placed or performed in visit on 03/28/22  POCT glycosylated hemoglobin (Hb A1C)  Result Value Ref Range   Hemoglobin A1C 5.6 4.0 - 5.6 %   HbA1c POC (<> result, manual entry)     HbA1c, POC (prediabetic range)     HbA1c, POC (controlled diabetic range)        The 10-year ASCVD risk score (Arnett DK, et al., 2019) is: 11.3%    Assessment & Plan:    Problem List Items Addressed This Visit       Cardiovascular and Mediastinum   Essential hypertension, benign    Well controlled. Continue current regimen. 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 Plan for repeat spirometry later this year.  Ok to use Mucinex PRN.         Endocrine   IFG (impaired fasting glucose) - Primary    A1C looks great. I think some of the ehe has made are really working even though no major change in regard to weight loss. F/U in 6 mo       Relevant Orders   POCT glycosylated hemoglobin (Hb A1C) (Completed)     Other   Tinnitus of right ear    Most supplements have very little benefit but they are safe to try.  Can always refer to ENT if getting worse over time.        GAD (generalized anxiety disorder)    Taking the  Atarax nightly to help him sleep. Needs new rx.       Relevant Medications   hydrOXYzine (ATARAX) 50 MG tablet   Other Visit Diagnoses     Anxiety       Relevant Medications   hydrOXYzine (ATARAX) 50 MG tablet       Return in about 6 months (around 09/27/2022) for Hypertension.    Nani Gasser, MD

## 2022-03-28 NOTE — Assessment & Plan Note (Signed)
Taking the Atarax nightly to help him sleep. Needs new rx.

## 2022-03-28 NOTE — Assessment & Plan Note (Signed)
Stable. Plan for repeat spirometry later this year.  Ok to use Mucinex PRN.

## 2022-03-28 NOTE — Assessment & Plan Note (Signed)
A1C looks great. I think some of the ehe has made are really working even though no major change in regard to weight loss. F/U in 6 mo

## 2022-03-28 NOTE — Assessment & Plan Note (Signed)
Most supplements have very little benefit but they are safe to try.  Can always refer to ENT if getting worse over time.

## 2022-04-03 ENCOUNTER — Other Ambulatory Visit: Payer: Self-pay | Admitting: *Deleted

## 2022-04-03 DIAGNOSIS — F419 Anxiety disorder, unspecified: Secondary | ICD-10-CM

## 2022-04-03 MED ORDER — HYDROXYZINE HCL 50 MG PO TABS: 50.0000 mg | ORAL_TABLET | Freq: Every evening | ORAL | 3 refills | Status: DC | PRN

## 2022-07-31 DIAGNOSIS — I10 Essential (primary) hypertension: Secondary | ICD-10-CM | POA: Diagnosis not present

## 2022-07-31 DIAGNOSIS — Z888 Allergy status to other drugs, medicaments and biological substances status: Secondary | ICD-10-CM | POA: Diagnosis not present

## 2022-07-31 DIAGNOSIS — R0981 Nasal congestion: Secondary | ICD-10-CM | POA: Diagnosis not present

## 2022-07-31 DIAGNOSIS — Z79899 Other long term (current) drug therapy: Secondary | ICD-10-CM | POA: Diagnosis not present

## 2022-07-31 DIAGNOSIS — Z8719 Personal history of other diseases of the digestive system: Secondary | ICD-10-CM | POA: Diagnosis not present

## 2022-07-31 DIAGNOSIS — Z886 Allergy status to analgesic agent status: Secondary | ICD-10-CM | POA: Diagnosis not present

## 2022-07-31 DIAGNOSIS — R059 Cough, unspecified: Secondary | ICD-10-CM | POA: Diagnosis not present

## 2022-07-31 DIAGNOSIS — Z882 Allergy status to sulfonamides status: Secondary | ICD-10-CM | POA: Diagnosis not present

## 2022-07-31 DIAGNOSIS — R5081 Fever presenting with conditions classified elsewhere: Secondary | ICD-10-CM | POA: Diagnosis not present

## 2022-07-31 DIAGNOSIS — J1282 Pneumonia due to coronavirus disease 2019: Secondary | ICD-10-CM | POA: Diagnosis not present

## 2022-07-31 DIAGNOSIS — U071 COVID-19: Secondary | ICD-10-CM | POA: Diagnosis not present

## 2022-07-31 DIAGNOSIS — R062 Wheezing: Secondary | ICD-10-CM | POA: Diagnosis not present

## 2022-07-31 DIAGNOSIS — R0602 Shortness of breath: Secondary | ICD-10-CM | POA: Diagnosis not present

## 2022-07-31 DIAGNOSIS — J90 Pleural effusion, not elsewhere classified: Secondary | ICD-10-CM | POA: Diagnosis not present

## 2022-07-31 DIAGNOSIS — J449 Chronic obstructive pulmonary disease, unspecified: Secondary | ICD-10-CM | POA: Diagnosis not present

## 2022-07-31 DIAGNOSIS — E785 Hyperlipidemia, unspecified: Secondary | ICD-10-CM | POA: Diagnosis not present

## 2022-07-31 DIAGNOSIS — Z885 Allergy status to narcotic agent status: Secondary | ICD-10-CM | POA: Diagnosis not present

## 2022-07-31 DIAGNOSIS — R9389 Abnormal findings on diagnostic imaging of other specified body structures: Secondary | ICD-10-CM | POA: Diagnosis not present

## 2022-07-31 DIAGNOSIS — R918 Other nonspecific abnormal finding of lung field: Secondary | ICD-10-CM | POA: Diagnosis not present

## 2022-08-28 ENCOUNTER — Telehealth: Payer: Self-pay

## 2022-08-28 NOTE — Telephone Encounter (Signed)
Pt called to request 90 day supply for all his meds. He is scheduled to see metheney 09/26/22. He did not specify which meds just "all my meds".

## 2022-08-29 ENCOUNTER — Other Ambulatory Visit: Payer: Self-pay | Admitting: *Deleted

## 2022-08-29 DIAGNOSIS — I10 Essential (primary) hypertension: Secondary | ICD-10-CM

## 2022-08-29 MED ORDER — AMLODIPINE BESYLATE 5 MG PO TABS: 5.0000 mg | ORAL_TABLET | Freq: Every day | ORAL | 1 refills | Status: DC

## 2022-08-29 MED ORDER — HYDROCHLOROTHIAZIDE 12.5 MG PO CAPS: 12.5000 mg | ORAL_CAPSULE | Freq: Every day | ORAL | 1 refills | Status: DC

## 2022-08-29 NOTE — Telephone Encounter (Signed)
Refills sent for the 2 medications that were due

## 2022-09-17 ENCOUNTER — Ambulatory Visit: Payer: Self-pay | Admitting: Licensed Clinical Social Worker

## 2022-09-17 NOTE — Patient Outreach (Signed)
  Care Coordination   Initial Visit Note   09/17/2022 Name: Lance Hernandez MRN: [id number] DOB: [birth year]  Lance Hernandez is a 66 y.o. year old male who sees Metheney, Barbarann Ehlers, MD for primary care. I spoke with  Lance Hernandez by phone today.  What matters to the patients health and wellness today? Client did not agree today to program services. He plans to talk with his PCP about program support    Goals Addressed             This Visit's Progress    patient did not agree to program services at this time; patient was encourged to talk with PCP about program support       Interventions Patient and LCSW spoke of program support today.   LCSW spoke with client about PCP support for client Client did not agree to program services today LCSW encouraged Lance Hernandez to speak with his PCP about program support      SDOH assessments and interventions completed:  Yes  SDOH Interventions Today    Flowsheet Row Most Recent Value  SDOH Interventions   Stress Interventions Other (Comment)  [client may have some stress related to managing medical needs]        Care Coordination Interventions:  Yes, provided   Follow up plan: Follow up call scheduled for 10/22/22 at 2:00 PM    Encounter Outcome:  Pt. Visit Completed   Lance Hernandez.Kishon Garriga MSW, LCSW Licensed Visual merchandiser [facility] Care Management [phone]

## 2022-09-17 NOTE — Patient Instructions (Signed)
Visit Information  Thank you for taking time to visit with me today. Please don't hesitate to contact me if I can be of assistance to you before our next scheduled telephone appointment.  Following are the goals we discussed today:   Our next appointment is by telephone on 10/22/22 at 2:00 PM   Please call the care guide team at [phone] if you need to cancel or reschedule your appointment.   If you are experiencing a Mental Health or Behavioral Health Crisis or need someone to talk to, please go to [facility] [address] [place] [phone])   Following is a copy of your full plan of care:   Interventions Patient and LCSW spoke of program support today.   LCSW spoke with client about PCP support for client Client did not agree to program services today LCSW encouraged Shayde Gervacio to speak with his PCP about program support  Mr. Damiano was given information about Care Management services by the embedded care coordination team including:  Care Management services include personalized support from designated clinical staff supervised by his physician, including individualized plan of care and coordination with other care providers 24/7 contact phone numbers for assistance for urgent and routine care needs. The patient may stop CCM services at any time (effective at the end of the month) by phone call to the office staff.  Patient agreed to services and verbal consent obtained.   Kelton Pillar.Brysen Shankman MSW, LCSW Licensed Visual merchandiser [facility] Care Management [phone]

## 2022-09-26 ENCOUNTER — Other Ambulatory Visit: Payer: Self-pay | Admitting: Family Medicine

## 2022-09-26 ENCOUNTER — Encounter: Payer: Self-pay | Admitting: Family Medicine

## 2022-09-26 ENCOUNTER — Ambulatory Visit (INDEPENDENT_AMBULATORY_CARE_PROVIDER_SITE_OTHER): Payer: BC Managed Care – PPO | Admitting: Family Medicine

## 2022-09-26 VITALS — BP 133/81 | HR 78 | Temp 97.0°F | Ht 66.0 in | Wt 240.1 lb

## 2022-09-26 DIAGNOSIS — I10 Essential (primary) hypertension: Secondary | ICD-10-CM | POA: Diagnosis not present

## 2022-09-26 DIAGNOSIS — Z0000 Encounter for general adult medical examination without abnormal findings: Secondary | ICD-10-CM | POA: Diagnosis not present

## 2022-09-26 DIAGNOSIS — N401 Enlarged prostate with lower urinary tract symptoms: Secondary | ICD-10-CM

## 2022-09-26 DIAGNOSIS — Z6838 Body mass index (BMI) 38.0-38.9, adult: Secondary | ICD-10-CM

## 2022-09-26 DIAGNOSIS — E785 Hyperlipidemia, unspecified: Secondary | ICD-10-CM

## 2022-09-26 DIAGNOSIS — F419 Anxiety disorder, unspecified: Secondary | ICD-10-CM

## 2022-09-26 DIAGNOSIS — R7301 Impaired fasting glucose: Secondary | ICD-10-CM

## 2022-09-26 LAB — POCT GLYCOSYLATED HEMOGLOBIN (HGB A1C): Hemoglobin A1C: 6.7 % — AB (ref 4.0–5.6)

## 2022-09-26 NOTE — Progress Notes (Deleted)
   Established Patient Office Visit  Subjective   Patient ID: Lance Hernandez, male    DOB: [birth year]  Age: 66 y.o. MRN: [id number]  No chief complaint on file.   HPI  Hypertension- Pt denies chest pain, SOB, dizziness, or heart palpitations.  Taking meds as directed w/o problems.  Denies medication side effects.  Home BPs have been good.   Impaired fasting glucose-no increased thirst or urination. No symptoms consistent with hypoglycemia.  F/U Anxiety -   Joined [employer].    {History (Optional):23778}  ROS    Objective:     There were no vitals taken for this visit. {Vitals History (Optional):23777}  Physical Exam Constitutional:      Appearance: He is well-developed.  HENT:     Head: Normocephalic and atraumatic.  Cardiovascular:     Rate and Rhythm: Normal rate and regular rhythm.     Heart sounds: Normal heart sounds.  Pulmonary:     Effort: Pulmonary effort is normal.     Breath sounds: Normal breath sounds.  Skin:    General: Skin is warm and dry.  Neurological:     Mental Status: He is alert and oriented to person, place, and time.  Psychiatric:        Behavior: Behavior normal.      No results found for any visits on 09/26/22.  {Labs (Optional):23779}  The 10-year ASCVD risk score (Arnett DK, et al., 2019) is: 14.3%    Assessment & Plan:   Problem List Items Addressed This Visit       Cardiovascular and Mediastinum   Essential hypertension, benign     Endocrine   IFG (impaired fasting glucose) - Primary    No follow-ups on file.    Nani Gasser, MD

## 2022-09-26 NOTE — Progress Notes (Signed)
Complete physical exam  Patient: Lance Hernandez   DOB: Nov 01, 1955   66 y.o. Male  MRN: [id number]  Subjective:    Chief Complaint  Patient presents with   Hypertension    Lance Hernandez is a 66 y.o. male who presents today for a complete physical exam. He reports consuming a general diet.  Has been going to the gym for the last 2 months.    He generally feels fairly well. He does have additional problems to discuss today.   He did recently have COVID and had to go to the emergency department.  He did develop a rash afterwards he is not sure if it was from the methylprednisolone or the Paxlovid.  Would really like to lose weight he just really struggles with food choices.  He has been going to the gym regularly for the last couple of months.  He did try over-the-counter go low but says it really did not work well for him.   Most recent fall risk assessment:    09/26/2022    8:53 AM  Fall Risk   Falls in the past year? 0  Number falls in past yr: 0  Injury with Fall? 0  Risk for fall due to : No Fall Risks  Follow up Falls evaluation completed     Most recent depression screenings:    09/26/2022    8:54 AM 09/24/2021    9:29 AM  PHQ 2/9 Scores  PHQ - 2 Score 0 0        Patient Care Team: Agapito Games, MD as PCP - General (Family Medicine)   Outpatient Medications Prior to Visit  Medication Sig   acetaminophen (TYLENOL) 325 MG tablet Take 650 mg by mouth every 6 (six) hours as needed.   albuterol (VENTOLIN HFA) 108 (90 Base) MCG/ACT inhaler INHALE 2 PUFFS INTO LUNGS EVERY 6 HOURS AS NEEDED FOR WHEEZING FOR SHORTNESS OF BREATH   amLODipine (NORVASC) 5 MG tablet Take 1 tablet (5 mg total) by mouth daily.   atorvastatin (LIPITOR) 40 MG tablet Take 1 tablet (40 mg total) by mouth daily.   finasteride (PROSCAR) 5 MG tablet Take 1 tablet (5 mg total) by mouth daily.   hydrochlorothiazide (MICROZIDE) 12.5 MG capsule Take 1 capsule (12.5 mg total) by mouth daily.    hydrocortisone (ANUSOL-HC) 2.5 % rectal cream Place 1 application rectally 2 (two) times daily.   hydrocortisone (ANUSOL-HC) 25 MG suppository Place 1 suppository (25 mg total) rectally 2 (two) times daily.   hydrOXYzine (ATARAX) 50 MG tablet Take 1 tablet (50 mg total) by mouth at bedtime as needed.   ibuprofen (ADVIL) 200 MG tablet Take 200 mg by mouth every 6 (six) hours as needed.   melatonin 5 MG TABS Take 5 mg by mouth at bedtime as needed.   metroNIDAZOLE (METROCREAM) 0.75 % cream Apply topically 2 (two) times daily.   multivitamin (ONE-A-DAY MEN'S) TABS tablet Take 1 tablet by mouth daily.   omeprazole (PRILOSEC) 20 MG capsule Take 1 capsule (20 mg total) by mouth daily.   valsartan (DIOVAN) 320 MG tablet Take 1 tablet (320 mg total) by mouth daily.   No facility-administered medications prior to visit.    ROS        Objective:     BP 133/81 (BP Location: Left Arm, Patient Position: Sitting, Cuff Size: Large)   Pulse 78   Temp (!) 97 F (36.1 C)   Ht 5\' 6"  (1.676 m)   Wt 240 lb 1.9  oz (108.9 kg)   SpO2 97%   BMI 38.76 kg/m    Physical Exam Constitutional:      Appearance: He is well-developed.  HENT:     Head: Normocephalic and atraumatic.     Right Ear: Tympanic membrane, ear canal and external ear normal.     Left Ear: Tympanic membrane, ear canal and external ear normal.     Nose: Nose normal.     Mouth/Throat:     Pharynx: Oropharynx is clear.  Eyes:     Conjunctiva/sclera: Conjunctivae normal.     Pupils: Pupils are equal, round, and reactive to light.  Neck:     Thyroid: No thyromegaly.  Cardiovascular:     Rate and Rhythm: Normal rate and regular rhythm.     Heart sounds: Normal heart sounds.  Pulmonary:     Effort: Pulmonary effort is normal.     Breath sounds: Normal breath sounds.  Abdominal:     General: Bowel sounds are normal. There is no distension.     Palpations: Abdomen is soft. There is no mass.     Tenderness: There is no abdominal  tenderness. There is no guarding or rebound.  Musculoskeletal:        General: Normal range of motion.     Cervical back: Normal range of motion and neck supple. No tenderness.  Lymphadenopathy:     Cervical: No cervical adenopathy.  Skin:    General: Skin is warm and dry.  Neurological:     Mental Status: He is alert and oriented to person, place, and time.     Deep Tendon Reflexes: Reflexes are normal and symmetric.  Psychiatric:        Behavior: Behavior normal.        Thought Content: Thought content normal.        Judgment: Judgment normal.      Results for orders placed or performed in visit on 09/26/22  POCT glycosylated hemoglobin (Hb A1C)  Result Value Ref Range   Hemoglobin A1C 6.7 (A) 4.0 - 5.6 %   HbA1c POC (<> result, manual entry)     HbA1c, POC (prediabetic range)     HbA1c, POC (controlled diabetic range)         Assessment & Plan:    Routine Health Maintenance and Physical Exam  Immunization History  Administered Date(s) Administered   Influenza Split 07/18/2012   Influenza, High Dose Seasonal PF 07/14/2022   Influenza,inj,Quad PF,6+ Mos 07/08/2014, 07/15/2015, 06/18/2016, 06/23/2017, 07/04/2018, 07/05/2020, 06/20/2021   Influenza-Unspecified 07/04/2018, 08/05/2019   PFIZER(Purple Top)SARS-COV-2 Vaccination 12/31/2019, 01/26/2020   PNEUMOCOCCAL CONJUGATE-20 05/30/2021   PPD Test 02/18/2018   Pneumococcal Polysaccharide-23 06/18/2016   Tdap 04/29/2013   Zoster Recombinat (Shingrix) 08/28/2018, 12/01/2018    Health Maintenance  Topic Date Due   COVID-19 Vaccine (3 - 2023-24 season) 10/12/2022 (Originally 06/14/2022)   DTaP/Tdap/Td (2 - Td or Tdap) 04/30/2023   COLONOSCOPY (Pts 45-39yrs Insurance coverage will need to be confirmed)  06/08/2025   Pneumonia Vaccine 49+ Years old  Completed   INFLUENZA VACCINE  Completed   Hepatitis C Screening  Completed   HIV Screening  Completed   Zoster Vaccines- Shingrix  Completed   HPV VACCINES  Aged Out     Discussed health benefits of physical activity, and encouraged him to engage in regular exercise appropriate for his age and condition.  Problem List Items Addressed This Visit       Cardiovascular and Mediastinum   Essential hypertension, benign  Endocrine   IFG (impaired fasting glucose)   Relevant Orders   POCT glycosylated hemoglobin (Hb A1C) (Completed)   Other Visit Diagnoses     BMI 38.0-38.9,adult    -  Primary   Anxiety       Wellness examination       Relevant Orders   Lipid Panel w/reflex Direct LDL   COMPLETE METABOLIC PANEL WITH GFR     Keep up a regular exercise program and make sure you are eating a healthy diet Try to eat 4 servings of dairy a day, or if you are lactose intolerant take a calcium with vitamin D daily.  Your vaccines are up to date.  Encouraged him to consider the RSV vaccine but he would need to check with his insurance on coverage since it is fairly new.   BMI 38-we did discuss using a calorie counter that might help him zone in on reducing daily caloric intake to week his weight loss goals.  He could also check with his insurance to see if there may be some prescriptions that are covered under his plan.  Return in about 6 months (around 03/28/2023) for spirometry .     Nani Gasser, MD

## 2022-09-27 LAB — COMPLETE METABOLIC PANEL WITH GFR
AG Ratio: 1.7 (calc) (ref 1.0–2.5)
ALT: 33 U/L (ref 9–46)
AST: 24 U/L (ref 10–35)
Albumin: 4.7 g/dL (ref 3.6–5.1)
Alkaline phosphatase (APISO): 52 U/L (ref 35–144)
BUN: 17 mg/dL (ref 7–25)
CO2: 27 mmol/L (ref 20–32)
Calcium: 10 mg/dL (ref 8.6–10.3)
Chloride: 101 mmol/L (ref 98–110)
Creat: 1.06 mg/dL (ref 0.70–1.35)
Globulin: 2.8 g/dL (calc) (ref 1.9–3.7)
Glucose, Bld: 121 mg/dL — ABNORMAL HIGH (ref 65–99)
Potassium: 3.7 mmol/L (ref 3.5–5.3)
Sodium: 140 mmol/L (ref 135–146)
Total Bilirubin: 1.4 mg/dL — ABNORMAL HIGH (ref 0.2–1.2)
Total Protein: 7.5 g/dL (ref 6.1–8.1)
eGFR: 78 mL/min/{1.73_m2} (ref >=60)

## 2022-09-27 LAB — LIPID PANEL W/REFLEX DIRECT LDL
Cholesterol: 167 mg/dL (ref <200)
HDL: 52 mg/dL (ref >=40)
LDL Cholesterol (Calc): 90 mg/dL (calc)
Non-HDL Cholesterol (Calc): 115 mg/dL (calc) (ref <130)
Total CHOL/HDL Ratio: 3.2 (calc) (ref <5.0)
Triglycerides: 145 mg/dL (ref <150)

## 2022-09-27 NOTE — Progress Notes (Signed)
Your lab work is within acceptable range and there are no concerning findings.   ?

## 2022-10-07 ENCOUNTER — Other Ambulatory Visit: Payer: Self-pay | Admitting: Family Medicine

## 2022-10-07 DIAGNOSIS — K219 Gastro-esophageal reflux disease without esophagitis: Secondary | ICD-10-CM

## 2022-10-22 ENCOUNTER — Encounter: Payer: BC Managed Care – PPO | Admitting: Licensed Clinical Social Worker

## 2022-11-06 ENCOUNTER — Ambulatory Visit: Payer: Self-pay | Admitting: Licensed Clinical Social Worker

## 2022-11-06 NOTE — Patient Outreach (Signed)
  Care Coordination   11/06/2022 Name: Lance Hernandez MRN: [id number] DOB: May 07, 1956   Care Coordination Outreach Attempts:  An unsuccessful telephone outreach was attempted today to offer the patient information about available care coordination services as a benefit of their health plan.   Follow Up Plan:  Additional outreach attempts will be made to offer the patient care coordination information and services.   Encounter Outcome:  No Answer   Care Coordination Interventions:  No, not indicated    Norva Riffle.Vayla Wilhelmi MSW, [place] [employer] representative [facility] Care Management [phone]

## 2023-01-05 ENCOUNTER — Other Ambulatory Visit: Payer: Self-pay | Admitting: Family Medicine

## 2023-01-05 DIAGNOSIS — N401 Enlarged prostate with lower urinary tract symptoms: Secondary | ICD-10-CM

## 2023-01-05 DIAGNOSIS — E785 Hyperlipidemia, unspecified: Secondary | ICD-10-CM

## 2023-01-05 DIAGNOSIS — I10 Essential (primary) hypertension: Secondary | ICD-10-CM

## 2023-03-19 ENCOUNTER — Other Ambulatory Visit: Payer: Self-pay | Admitting: Family Medicine

## 2023-03-19 DIAGNOSIS — I10 Essential (primary) hypertension: Secondary | ICD-10-CM

## 2023-03-21 ENCOUNTER — Other Ambulatory Visit: Payer: Self-pay | Admitting: *Deleted

## 2023-03-21 DIAGNOSIS — I10 Essential (primary) hypertension: Secondary | ICD-10-CM

## 2023-03-21 MED ORDER — HYDROCHLOROTHIAZIDE 12.5 MG PO CAPS: 12.5000 mg | ORAL_CAPSULE | Freq: Every day | ORAL | 1 refills | Status: DC

## 2023-04-24 ENCOUNTER — Other Ambulatory Visit: Payer: BC Managed Care – PPO

## 2023-05-16 ENCOUNTER — Telehealth: Payer: Self-pay | Admitting: Family Medicine

## 2023-05-16 NOTE — Telephone Encounter (Signed)
Lance Hernandez at [employer] called in requesting that Dr. Linford Hernandez be placed as PCP. Patient has switched to [facility], and they are unable to create ID cards. [employer] requested that we call the Provider Department at [phone] to give an approval to do so.

## 2023-05-22 ENCOUNTER — Telehealth: Payer: Self-pay | Admitting: Family Medicine

## 2023-05-22 NOTE — Telephone Encounter (Signed)
Patient contacted their insurance and the insurance contacted [place] and they state they need someone from [facility] to give them access to add Metheney to the insurance card. I'm not sure if this is a credentialing issue.

## 2023-05-29 NOTE — Telephone Encounter (Signed)
Lance Hernandez has reached out to Randon Goldsmith and [employer] in Credentialing so they can work on this for the patient and his wife who is also a pt.

## 2023-06-04 ENCOUNTER — Telehealth: Payer: Self-pay | Admitting: Family Medicine

## 2023-06-04 NOTE — Telephone Encounter (Signed)
Patient called in stating that [facility] says update Metheney's credentialing to Accepting New Patients' so he can come to his appointment. States that [facility] will not cover visit. [employer] also states that he is being considered as a new patient. Patient states he received his ID cards.

## 2023-06-10 ENCOUNTER — Other Ambulatory Visit: Payer: Self-pay | Admitting: Family Medicine

## 2023-06-10 DIAGNOSIS — F419 Anxiety disorder, unspecified: Secondary | ICD-10-CM

## 2023-06-27 ENCOUNTER — Ambulatory Visit (INDEPENDENT_AMBULATORY_CARE_PROVIDER_SITE_OTHER): Payer: Medicare Other | Admitting: Family Medicine

## 2023-06-27 ENCOUNTER — Ambulatory Visit: Payer: Medicare Other

## 2023-06-27 ENCOUNTER — Ambulatory Visit: Payer: BC Managed Care – PPO | Admitting: Family Medicine

## 2023-06-27 VITALS — BP 123/82 | HR 68 | Ht 66.0 in | Wt 245.2 lb

## 2023-06-27 DIAGNOSIS — R0602 Shortness of breath: Secondary | ICD-10-CM

## 2023-06-27 DIAGNOSIS — J449 Chronic obstructive pulmonary disease, unspecified: Secondary | ICD-10-CM

## 2023-06-27 MED ORDER — ALBUTEROL SULFATE (2.5 MG/3ML) 0.083% IN NEBU
2.5000 mg | INHALATION_SOLUTION | Freq: Once | RESPIRATORY_TRACT | Status: AC
  Administered 2023-06-27: 2.5 mg via RESPIRATORY_TRACT

## 2023-06-27 NOTE — Progress Notes (Signed)
Established Patient Office Visit  Subjective   Patient ID: Lance Hernandez, male    DOB: [birth year]  Age: 67 y.o. MRN: [id number]  Chief Complaint  Patient presents with   Shortness of Breath    Spirometry-    HPI   Here today for spirometry testing.  Will set up a later appointment to go over results.   ROS    Objective:     BP 123/82   Pulse 68   Ht 5\' 6"  (1.676 m)   Wt 245 lb 4 oz (111.2 kg)   SpO2 97%   BMI 39.58 kg/m    Physical Exam   No results found for any visits on 06/27/23.    The 10-year ASCVD risk score (Arnett DK, et al., 2019) is: 12%    Assessment & Plan:   Problem List Items Addressed This Visit   None Visit Diagnoses     Shortness of breath [R06.02]    -  Primary   Relevant Medications   albuterol (PROVENTIL) (2.5 MG/3ML) 0.083% nebulizer solution 2.5 mg (Completed)      Spirometry shows FVC of 67%, FEV1 48% with a ratio of 54.  Consistent with severe obstruction.  He also has a significant decrease in FVC consistent with some restrictive disease as well.   No follow-ups on file.    Nani Gasser, MD

## 2023-07-01 ENCOUNTER — Encounter: Payer: Self-pay | Admitting: Family Medicine

## 2023-07-01 ENCOUNTER — Ambulatory Visit (INDEPENDENT_AMBULATORY_CARE_PROVIDER_SITE_OTHER): Payer: Medicare Other | Admitting: Family Medicine

## 2023-07-01 ENCOUNTER — Telehealth: Payer: Self-pay | Admitting: Family Medicine

## 2023-07-01 VITALS — BP 126/67 | HR 90 | Ht 66.0 in | Wt 244.0 lb

## 2023-07-01 DIAGNOSIS — K219 Gastro-esophageal reflux disease without esophagitis: Secondary | ICD-10-CM

## 2023-07-01 DIAGNOSIS — J449 Chronic obstructive pulmonary disease, unspecified: Secondary | ICD-10-CM

## 2023-07-01 DIAGNOSIS — R3912 Poor urinary stream: Secondary | ICD-10-CM | POA: Diagnosis not present

## 2023-07-01 DIAGNOSIS — E785 Hyperlipidemia, unspecified: Secondary | ICD-10-CM

## 2023-07-01 DIAGNOSIS — F419 Anxiety disorder, unspecified: Secondary | ICD-10-CM

## 2023-07-01 DIAGNOSIS — R7301 Impaired fasting glucose: Secondary | ICD-10-CM

## 2023-07-01 DIAGNOSIS — I10 Essential (primary) hypertension: Secondary | ICD-10-CM | POA: Diagnosis not present

## 2023-07-01 DIAGNOSIS — Z23 Encounter for immunization: Secondary | ICD-10-CM

## 2023-07-01 DIAGNOSIS — N401 Enlarged prostate with lower urinary tract symptoms: Secondary | ICD-10-CM

## 2023-07-01 DIAGNOSIS — R252 Cramp and spasm: Secondary | ICD-10-CM

## 2023-07-01 MED ORDER — HYDROCHLOROTHIAZIDE 12.5 MG PO CAPS
12.5000 mg | ORAL_CAPSULE | Freq: Every day | ORAL | 1 refills | Status: DC
Start: 2023-07-01 — End: 2023-12-15

## 2023-07-01 MED ORDER — VALSARTAN 320 MG PO TABS: 320.0000 mg | ORAL_TABLET | Freq: Every day | ORAL | 3 refills | Status: DC

## 2023-07-01 MED ORDER — HYDROXYZINE HCL 50 MG PO TABS: 50.0000 mg | ORAL_TABLET | Freq: Every evening | ORAL | 3 refills | Status: DC | PRN

## 2023-07-01 MED ORDER — ATORVASTATIN CALCIUM 40 MG PO TABS: 40.0000 mg | ORAL_TABLET | Freq: Every day | ORAL | 3 refills | Status: DC

## 2023-07-01 MED ORDER — OMEPRAZOLE 20 MG PO CPDR: 20.0000 mg | DELAYED_RELEASE_CAPSULE | Freq: Every day | ORAL | 3 refills | Status: DC

## 2023-07-01 MED ORDER — AMLODIPINE BESYLATE 5 MG PO TABS
5.0000 mg | ORAL_TABLET | Freq: Every day | ORAL | 1 refills | Status: DC
Start: 2023-07-01 — End: 2023-12-15

## 2023-07-01 MED ORDER — ANORO ELLIPTA 62.5-25 MCG/ACT IN AEPB
1.0000 | INHALATION_SPRAY | Freq: Every day | RESPIRATORY_TRACT | 4 refills | Status: DC
Start: 2023-07-01 — End: 2023-07-02

## 2023-07-01 MED ORDER — FINASTERIDE 5 MG PO TABS: 5.0000 mg | ORAL_TABLET | Freq: Every day | ORAL | 3 refills | Status: DC

## 2023-07-01 MED ORDER — ALBUTEROL SULFATE HFA 108 (90 BASE) MCG/ACT IN AERS: INHALATION_SPRAY | RESPIRATORY_TRACT | 3 refills | Status: DC

## 2023-07-01 NOTE — Assessment & Plan Note (Signed)
Blood pressure looks fantastic.  Due for updated labs.  Will follow-up in 6 months.

## 2023-07-01 NOTE — Progress Notes (Signed)
Established Patient Office Visit  Subjective   Patient ID: Lance Hernandez, male    DOB: 01/30/1956  Age: 67 y.o. MRN: [id number]  Chief Complaint  Patient presents with   Results         HPI Hersel is here today to go over recent spirometry results from September 13.  FVC was 67%, FEV1 was 48% with a ratio of 54.  He had no significant change in FEV1 after albuterol 2% improvement.  Effort was good.  He currently just uses albuterol as needed he is not on a daily prophylactic inhaler.  He has noticed over the last 5 months he has had more frequent cough and more frequent phlegm production he does take Mucinex.  He denies any increased shortness of breath.  He does use his inhaler occasionally.  More recently he has been going to the gym 3 days a week trying to lose weight.  He just really struggles with making dietary changes he feels very fixated to his childhood foods.  He was also noted to have a low potassium when he was in the ED last October and is concerned about that.  Also reports that he gets a lot of cramping in his toes no numbness or tingling.  Pretension-he showed me some of his home blood pressures which look absolutely fantastic.  One yesterday was 111/73.    ROS    Objective:     BP 126/67   Pulse 90   Ht 5\' 6"  (1.676 m)   Wt 244 lb (110.7 kg)   SpO2 96%   BMI 39.38 kg/m    Physical Exam Vitals and nursing note reviewed.  Constitutional:      Appearance: Normal appearance.  HENT:     Head: Normocephalic and atraumatic.  Eyes:     Conjunctiva/sclera: Conjunctivae normal.  Cardiovascular:     Rate and Rhythm: Normal rate and regular rhythm.  Pulmonary:     Effort: Pulmonary effort is normal.     Breath sounds: Normal breath sounds.  Skin:    General: Skin is warm and dry.  Neurological:     Mental Status: He is alert.  Psychiatric:        Mood and Affect: Mood normal.     No results found for any visits on 07/01/23.    The 10-year ASCVD  risk score (Arnett DK, et al., 2019) is: 12.4%    Assessment & Plan:   Problem List Items Addressed This Visit       Cardiovascular and Mediastinum   Essential hypertension, benign    Blood pressure looks fantastic.  Due for updated labs.  Will follow-up in 6 months.      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  The FVC was 67%, FEV1 was 48% with a ratio of 54 consistent with severe obstruction.  This is fairly stable actually from 2019's almost 5 years ago when his FEV1 was 49% and FVC was 65% ratio was 56. CAT score of 22.    Will start anoro.  F/U in 8 weeks.        Relevant Medications   umeclidinium-vilanterol (ANORO  ELLIPTA) 62.5-25 MCG/ACT AEPB   albuterol (VENTOLIN HFA) 108 (90 Base) MCG/ACT inhaler   Other Relevant Orders   CMP14+EGFR   CBC     Endocrine   IFG (impaired fasting glucose)    Due for updated A1C       Relevant Orders   CMP14+EGFR   CBC   Hemoglobin A1c     Genitourinary   BPH (benign prostatic hyperplasia)   Relevant Medications   finasteride (PROSCAR) 5 MG tablet     Other   Hyperlipidemia    Due to recheck lipids in December.      Relevant Medications   amLODipine (NORVASC) 5 MG tablet   atorvastatin (LIPITOR) 40 MG tablet   hydrochlorothiazide (MICROZIDE) 12.5 MG capsule   valsartan (DIOVAN) 320 MG tablet   Other Visit Diagnoses     Encounter for immunization       Relevant Orders   Flu Vaccine Trivalent High Dose (Fluad) (Completed)   Cramp of toe       Anxiety       Relevant Medications   hydrOXYzine (ATARAX) 50 MG tablet   Gastroesophageal reflux disease without esophagitis       Relevant Medications    omeprazole (PRILOSEC) 20 MG capsule       Toe cramping.  Can refer to Podiatry. He will hold off for now and will let me know if wants refe.    Return in about 2 months (around 08/31/2023) for lungs, cough, mucous.    Nani Gasser, MD

## 2023-07-01 NOTE — Telephone Encounter (Signed)
Patient called requesting his prescriptions of hydrOXYzine (ATARAX) 50 MG tablet [[id number]] switched to levocetirizine and umeclidinium-vilanterol (ANORO ELLIPTA) 62.5-25 MCG/ACT AEPB [[id number]] switched to ipratropium.   Patient spoke to his insurance and was informed they would cover these generic versions of his medications  Pharmacy -  [phone] - [place], [place] - [address]

## 2023-07-01 NOTE — Assessment & Plan Note (Signed)
Due to recheck lipids in December.

## 2023-07-01 NOTE — Assessment & Plan Note (Signed)
Due for updated A1C.

## 2023-07-01 NOTE — Assessment & Plan Note (Addendum)
Reviewed updated spirometry results today from 06/27/2023.  The FVC was 67%, FEV1 was 48% with a ratio of 54 consistent with severe obstruction.  This is fairly stable actually from 2019's almost 5 years ago when his FEV1 was 49% and FVC was 65% ratio was 56. CAT score of 22.    Will start anoro.  F/U in 8 weeks.

## 2023-07-02 ENCOUNTER — Encounter: Payer: Self-pay | Admitting: Family Medicine

## 2023-07-02 MED ORDER — LEVOCETIRIZINE DIHYDROCHLORIDE 5 MG PO TABS: 5.0000 mg | ORAL_TABLET | Freq: Every evening | ORAL | 3 refills | Status: DC | PRN

## 2023-07-02 MED ORDER — COMBIVENT RESPIMAT 20-100 MCG/ACT IN AERS: 1.0000 | INHALATION_SPRAY | Freq: Three times a day (TID) | RESPIRATORY_TRACT | 3 refills | Status: DC

## 2023-07-02 NOTE — Progress Notes (Signed)
Hi Lance Hernandez, your liver function is up.  Make sure to be avoiding any excess Tylenol or alcohol products and I like to recheck your liver function in about 3 weeks.  Your A1c is better this time which is great.  So just continue to work on those healthy changes and trying to make small steps to improve your diet and increase your activity level.  Your blood count looks great no sign of anemia.

## 2023-07-02 NOTE — Telephone Encounter (Signed)
Spoke w/pcp will switch the hydroxyzine for him. Will fwd the the other medication Anoro Ellipta for her to look at .

## 2023-07-02 NOTE — Telephone Encounter (Signed)
OK will change anoro to Combivent  Meds ordered this encounter  Medications   levocetirizine (XYZAL) 5 MG tablet    Sig: Take 1 tablet (5 mg total) by mouth at bedtime as needed for allergies.    Dispense:  90 tablet    Refill:  3   Ipratropium-Albuterol (COMBIVENT RESPIMAT) 20-100 MCG/ACT AERS respimat    Sig: Inhale 1 puff into the lungs in the morning, at noon, and at bedtime.    Dispense:  12 g    Refill:  3

## 2023-07-03 NOTE — Addendum Note (Signed)
Addended by: Chalmers Cater on: 07/03/2023 11:47 AM   Modules accepted: Orders

## 2023-07-04 NOTE — Telephone Encounter (Signed)
Spoke with patient - informed that the combvient was to be taken 3 times per day -   Regarding the hydroxyzine and xyzal - he will attempt to take the xyzal and see if this works if not,  He will call back to do [facility] for hydroxyzine and let us know where to send script.  If return call call [phone]

## 2023-07-07 ENCOUNTER — Telehealth: Payer: Self-pay | Admitting: Family Medicine

## 2023-07-07 DIAGNOSIS — H1045 Other chronic allergic conjunctivitis: Secondary | ICD-10-CM | POA: Diagnosis not present

## 2023-07-07 DIAGNOSIS — H3563 Retinal hemorrhage, bilateral: Secondary | ICD-10-CM | POA: Diagnosis not present

## 2023-07-07 DIAGNOSIS — H43813 Vitreous degeneration, bilateral: Secondary | ICD-10-CM | POA: Diagnosis not present

## 2023-07-07 DIAGNOSIS — H00011 Hordeolum externum right upper eyelid: Secondary | ICD-10-CM | POA: Diagnosis not present

## 2023-07-07 NOTE — Telephone Encounter (Signed)
Was already sent on the 18th for Combivent.  He had already called [place] in that regard.

## 2023-07-07 NOTE — Telephone Encounter (Signed)
Pt called.  He cannot afford Anoro Ellipta at $47 a month. Can you change to Combivent AER 22-100 mcg(lower tier to level 1 per [facility] to make it more affordable? (Not sure if I got this all correct it was kind of difficult to get)

## 2023-09-01 ENCOUNTER — Telehealth: Payer: Self-pay | Admitting: Family Medicine

## 2023-09-01 ENCOUNTER — Ambulatory Visit (INDEPENDENT_AMBULATORY_CARE_PROVIDER_SITE_OTHER): Payer: Medicare Other | Admitting: Family Medicine

## 2023-09-01 ENCOUNTER — Encounter: Payer: Self-pay | Admitting: Family Medicine

## 2023-09-01 VITALS — BP 120/64 | HR 77 | Ht 66.0 in | Wt 249.0 lb

## 2023-09-01 DIAGNOSIS — R7301 Impaired fasting glucose: Secondary | ICD-10-CM

## 2023-09-01 DIAGNOSIS — Z125 Encounter for screening for malignant neoplasm of prostate: Secondary | ICD-10-CM

## 2023-09-01 DIAGNOSIS — Z6841 Body Mass Index (BMI) 40.0 and over, adult: Secondary | ICD-10-CM

## 2023-09-01 DIAGNOSIS — R748 Abnormal levels of other serum enzymes: Secondary | ICD-10-CM | POA: Diagnosis not present

## 2023-09-01 DIAGNOSIS — J449 Chronic obstructive pulmonary disease, unspecified: Secondary | ICD-10-CM | POA: Diagnosis not present

## 2023-09-01 NOTE — Telephone Encounter (Signed)
OK please update med list

## 2023-09-01 NOTE — Telephone Encounter (Signed)
Patient called and wanted to stay on anoro ellipta 62.5/25mcg instead of lpratropium--Albuterol 20-100 he says its the same price  [facility] [address] [place] [address] [phone]

## 2023-09-01 NOTE — Assessment & Plan Note (Signed)
Last A1c in September was improved.  Continue to work on healthy diet and regular exercise he has been trying to walk at the gym twice a week.  And is really looking at doing some dieting to try to lose weight.  Recheck in 3 months.

## 2023-09-01 NOTE — Progress Notes (Signed)
Established Patient Office Visit  Subjective   Patient ID: Lance Hernandez, male    DOB: 07-31-1956  Age: 67 y.o. MRN: [id number]  Chief Complaint  Patient presents with   COPD    Pt would like to know if there is something he can take for the phlegm that he      HPI  F/U COPD and chronic sputum production.  The Anoro was [place] $47 a month so he will let us know in September we had sent in a prescription for Combivent.  But he says the pharmacy never notified him that there was a prescription available.  So right now he is actually not on any medication and he does report some chronic cough and mucus production.  He wants to know what we can do about the mucus.  Wanted to discuss keto diet next year.  Friend he is doing keto diet right now and wanted to see if this might be an option for him he really struggles with craving and eating a lot of carbs and breads but really wants to try to cut back and stop eating the carbs and potatoes etc.   Elevated liver enzymes - rarely drinks alcohol.  Occ takes Tylenol.   He brought in the prescription for generic size all he was not sure what it was really for.  For allergies and itching.     ROS    Objective:     BP 120/64   Pulse 77   Ht 5\' 6"  (1.676 m)   Wt 249 lb (112.9 kg)   SpO2 96%   BMI 40.19 kg/m    Physical Exam Vitals and nursing note reviewed.  Constitutional:      Appearance: Normal appearance.  HENT:     Head: Normocephalic and atraumatic.  Eyes:     Conjunctiva/sclera: Conjunctivae normal.  Cardiovascular:     Rate and Rhythm: Normal rate and regular rhythm.  Pulmonary:     Effort: Pulmonary effort is normal.     Breath sounds: Normal breath sounds.     Comments: Diffuse coarse BS.  Skin:    General: Skin is warm and dry.  Neurological:     Mental Status: He is alert.  Psychiatric:        Mood and Affect: Mood normal.      No results found for any visits on 09/01/23.    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  They are going to go ahead and get it ready for him again and he is going to pick it up and start the Combivent I want to see if this helps with some of the chronic cough and mucus production he is not currently in an acute exacerbation which is reassuring.  If he is not proving him after a month on the medication then we can always look at other options.  Our machine from September showed severe obstruction.        Endocrine   IFG (impaired fasting glucose) - Primary    Last A1c in September was improved.  Continue to work on healthy diet and regular exercise he has been trying to walk at the gym twice a week.  And is really looking at doing some dieting to try to lose weight.  Recheck in 3 months.        Other   BMI 40.0-44.9, adult (HCC)    BMI 40-he is considering doing a keto diet we did discuss using more healthy leaner proteins and not just eating a lot of greasy or red meat.  And eating a lot of vegetables that is going to be his biggest struggle is being able to feel full with his meals because he really does not like or eat a lot of vegetables.  Just encouraged him to try to work at it continue to work out twice a week since he already is in that routine.  Likely start this in the new year.      Other Visit Diagnoses     Elevated liver enzymes       Relevant Orders   Hepatic function panel   Screening for malignant neoplasm of prostate       Relevant Orders   PSA       Elevated liver enzymes-will recheck liver function today.  We did discuss that diet can also contribute especially since he does not drink alcohol frequently.  He was taking some Tylenol occasionally around that time but does not [facility] he was taking very heavy doses.   Still elevated then recommend ultrasound of the liver for further workup.  Return in about 3 months (around 12/02/2023) for COPD, Pre-diabetes.    Nani Gasser, MD

## 2023-09-01 NOTE — Assessment & Plan Note (Signed)
BMI 40-he is considering doing a keto diet we did discuss using more healthy leaner proteins and not just eating a lot of greasy or red meat.  And eating a lot of vegetables that is going to be his biggest struggle is being able to feel full with his meals because he really does not like or eat a lot of vegetables.  Just encouraged him to try to work at it continue to work out twice a week since he already is in that routine.  Likely start this in the new year.

## 2023-09-01 NOTE — Assessment & Plan Note (Signed)
We the pharmacy and they did fill the prescription then put it back.  They are going to go ahead and get it ready for him again and he is going to pick it up and start the Combivent I want to see if this helps with some of the chronic cough and mucus production he is not currently in an acute exacerbation which is reassuring.  If he is not proving him after a month on the medication then we can always look at other options.  Our machine from September showed severe obstruction.

## 2023-09-02 LAB — HEPATIC FUNCTION PANEL
ALT: 71 [IU]/L — ABNORMAL HIGH (ref 0–44)
AST: 57 [IU]/L — ABNORMAL HIGH (ref 0–40)
Albumin: 4.4 g/dL (ref 3.9–4.9)
Alkaline Phosphatase: 67 [IU]/L (ref 44–121)
Bilirubin Total: 1 mg/dL (ref 0.0–1.2)
Bilirubin, Direct: 0.3 mg/dL (ref 0.00–0.40)
Total Protein: 7.5 g/dL (ref 6.0–8.5)

## 2023-09-02 LAB — PSA: Prostate Specific Ag, Serum: 0.6 ng/mL (ref 0.0–4.0)

## 2023-09-02 NOTE — Addendum Note (Signed)
Addended by: Nani Gasser D on: 09/02/2023 07:59 AM   Modules accepted: Orders

## 2023-09-02 NOTE — Progress Notes (Signed)
HI Lance Hernandez, liver enzymes are still up.  We will cal the lab and see if we can add a hepatitis panel and order an [place] of your liver.  Prostate test was normal.   Please add on hepatitis panel.

## 2023-09-03 ENCOUNTER — Other Ambulatory Visit: Payer: Medicare Other

## 2023-09-03 ENCOUNTER — Other Ambulatory Visit: Payer: Self-pay | Admitting: *Deleted

## 2023-09-03 DIAGNOSIS — R748 Abnormal levels of other serum enzymes: Secondary | ICD-10-CM | POA: Diagnosis not present

## 2023-09-03 DIAGNOSIS — J449 Chronic obstructive pulmonary disease, unspecified: Secondary | ICD-10-CM

## 2023-09-03 MED ORDER — ANORO ELLIPTA 62.5-25 MCG/ACT IN AEPB: 1.0000 | INHALATION_SPRAY | Freq: Every day | RESPIRATORY_TRACT | 4 refills | Status: DC

## 2023-09-03 NOTE — Telephone Encounter (Signed)
Medication sent.

## 2023-09-05 ENCOUNTER — Telehealth: Payer: Self-pay

## 2023-09-05 NOTE — Progress Notes (Signed)
Kobie, hepatitis panel was negative which is reassuring.  Go ahead and order the ultrasound so hopefully they will get you scheduled soon.

## 2023-09-05 NOTE — Telephone Encounter (Signed)
FYI -   Copied from CRM [id number]. Topic: General - Other >> Sep 05, 2023 10:35 AM Dimitri Ped wrote: Reason for CRM: patient calling to let doctor know that he had the ultrasound done Wednesday .

## 2023-09-10 ENCOUNTER — Encounter: Payer: Self-pay | Admitting: Family Medicine

## 2023-09-10 DIAGNOSIS — K760 Fatty (change of) liver, not elsewhere classified: Secondary | ICD-10-CM | POA: Insufficient documentation

## 2023-09-10 DIAGNOSIS — K8020 Calculus of gallbladder without cholecystitis without obstruction: Secondary | ICD-10-CM | POA: Insufficient documentation

## 2023-09-10 NOTE — Progress Notes (Signed)
Hi Lance Hernandez, our  ultrasound shows that you do have a few small mobile gallstones.  Nothing causing obstruction or problem at this point.  Right kidney looks normal.  The left kidney does have a 5 centimeters cyst.  It appears to be what is called a "simple" cyst these are benign.  They did see some increased fat content in the liver.  Recommend continuing to work on diet, weight loss and increased exercise to reduce the fat content in the liver.  Over time this can lead to inflammation.  Spleen looks normal.

## 2023-09-26 LAB — ACUTE VIRAL HEPATITIS (HAV, HBV, HCV)
HCV Ab: NONREACTIVE
Hep A IgM: NEGATIVE
Hep B C IgM: NEGATIVE
Hepatitis B Surface Ag: NEGATIVE

## 2023-09-26 LAB — HCV INTERPRETATION

## 2023-09-26 LAB — SPECIMEN STATUS REPORT

## 2023-10-23 ENCOUNTER — Ambulatory Visit (INDEPENDENT_AMBULATORY_CARE_PROVIDER_SITE_OTHER): Payer: Medicare Other | Admitting: Family Medicine

## 2023-10-23 ENCOUNTER — Encounter: Payer: Self-pay | Admitting: Family Medicine

## 2023-10-23 VITALS — BP 133/74 | HR 74 | Ht 66.0 in | Wt 250.0 lb

## 2023-10-23 DIAGNOSIS — J449 Chronic obstructive pulmonary disease, unspecified: Secondary | ICD-10-CM

## 2023-10-23 DIAGNOSIS — Z0000 Encounter for general adult medical examination without abnormal findings: Secondary | ICD-10-CM | POA: Diagnosis not present

## 2023-10-23 DIAGNOSIS — R9431 Abnormal electrocardiogram [ECG] [EKG]: Secondary | ICD-10-CM | POA: Diagnosis not present

## 2023-10-23 MED ORDER — ALBUTEROL SULFATE HFA 108 (90 BASE) MCG/ACT IN AERS: INHALATION_SPRAY | RESPIRATORY_TRACT | 1 refills | Status: AC

## 2023-10-23 NOTE — Progress Notes (Signed)
 Annual Wellness Visit     Patient: Lance Hernandez, Male    DOB: 1955/10/25, 68 y.o.   MRN: [id number]  Subjective  Chief Complaint  Patient presents with   Annual Exam    Lance Hernandez is a 68 y.o. male who presents today for his Annual Wellness Visit. He reports consuming a general diet. Gym/ health club routine includes swimming and treadmill. He generally feels well. He reports sleeping fairly well. He does not have additional problems to discuss today.   HPI        Medications: Outpatient Medications Prior to Visit  Medication Sig   amLODipine  (NORVASC ) 5 MG tablet Take 1 tablet (5 mg total) by mouth daily.   atorvastatin  (LIPITOR) 40 MG tablet Take 1 tablet (40 mg total) by mouth daily.   finasteride  (PROSCAR ) 5 MG tablet Take 1 tablet (5 mg total) by mouth daily.   hydrochlorothiazide  (MICROZIDE ) 12.5 MG capsule Take 1 capsule (12.5 mg total) by mouth daily.   levocetirizine (XYZAL ) 5 MG tablet Take 1 tablet (5 mg total) by mouth at bedtime as needed for allergies.   multivitamin (ONE-A-DAY MEN'S) TABS tablet Take 1 tablet by mouth daily.   omeprazole  (PRILOSEC) 20 MG capsule Take 1 capsule (20 mg total) by mouth daily.   umeclidinium-vilanterol (ANORO ELLIPTA ) 62.5-25 MCG/ACT AEPB Inhale 1 puff into the lungs daily.   valsartan  (DIOVAN ) 320 MG tablet Take 1 tablet (320 mg total) by mouth daily.   [DISCONTINUED] albuterol  (VENTOLIN  HFA) 108 (90 Base) MCG/ACT inhaler INHALE 2 PUFFS INTO LUNGS EVERY 6 HOURS AS NEEDED FOR WHEEZING FOR SHORTNESS OF BREATH   No facility-administered medications prior to visit.    Allergies  Allergen Reactions   Aspirin Other (See Comments)    It's been decades ago. I don't remember   Phentermine  Other (See Comments)    Nausea   Prednisone  Rash   Sulfa Antibiotics Rash   Tamsulosin  Rash   Tramadol  Rash    Patient Care Team: Alvan Dorothyann BIRCH, MD as PCP - General (Family Medicine)  ROS      Objective  BP 133/74   Pulse 74    Ht 5' 6 (1.676 m)   Wt 250 lb (113.4 kg)   SpO2 96%   BMI 40.35 kg/m     Physical Exam Vitals and nursing note reviewed.  Constitutional:      Appearance: Normal appearance.  HENT:     Head: Normocephalic and atraumatic.  Eyes:     Conjunctiva/sclera: Conjunctivae normal.  Cardiovascular:     Rate and Rhythm: Normal rate and regular rhythm.  Pulmonary:     Effort: Pulmonary effort is normal.     Breath sounds: Normal breath sounds.  Skin:    General: Skin is warm and dry.  Neurological:     Mental Status: He is alert.  Psychiatric:        Mood and Affect: Mood normal.      Most recent functional status assessment:    10/23/2023    9:29 AM  In your present state of health, do you have any difficulty performing the following activities:  Hearing? 1  Comment pt reports that he has tinnitus which causes him not to hear well  Vision? 0  Difficulty concentrating or making decisions? 0  Walking or climbing stairs? 0  Dressing or bathing? 0  Doing errands, shopping? 0   Most recent fall risk assessment:    07/01/2023   11:55 AM  Fall Risk   Falls  in the past year? 0  Number falls in past yr: 0  Injury with Fall? 0  Risk for fall due to : No Fall Risks  Follow up Falls evaluation completed    Most recent depression screenings:    10/23/2023    9:36 AM 07/01/2023   11:55 AM  PHQ 2/9 Scores  PHQ - 2 Score 0 0   Most recent cognitive screening:    10/23/2023    9:31 AM  6CIT Screen  What Year? 0 points  What month? 0 points  What time? 0 points  Count back from 20 0 points  Months in reverse 0 points  Repeat phrase 6 points  Total Score 6 points   Most recent Audit-C alcohol use screening    10/23/2023   10:00 AM  Alcohol Use Disorder Test (AUDIT)  1. How often do you have a drink containing alcohol? 0  2. How many drinks containing alcohol do you have on a typical day when you are drinking? 0  3. How often do you have six or more drinks on one occasion? 0   AUDIT-C Score 0   A score of 3 or more in women, and 4 or more in men indicates increased risk for alcohol abuse, EXCEPT if all of the points are from question 1   Vision/Hearing Screen: Vision Screening   Right eye Left eye Both eyes  Without correction 20/25 20/25 20/25   With correction       Last hemoglobin A1c Lab Results  Component Value Date   HGBA1C 6.4 (H) 07/01/2023      No results found for any visits on 10/23/23.    Assessment & Plan   Annual wellness visit done today including the all of the following: Reviewed patient's Family Medical History Reviewed and updated list of patient's medical providers Assessment of cognitive impairment was done Assessed patient's functional ability Established a written schedule for health screening services Health Risk Assessent Completed and Reviewed  Exercise Activities and Dietary recommendations  Goals      Exercise 3x per week (30 min per time)     Continue to do your swimming and treadmill at the gym and try to add in maybe 2 days of resistance training.        Immunization History  Administered Date(s) Administered   Fluad Trivalent(High Dose 65+) 07/01/2023   Influenza Split 07/18/2012   Influenza, High Dose Seasonal PF 07/14/2022   Influenza,inj,Quad PF,6+ Mos 07/08/2014, 07/15/2015, 06/18/2016, 06/23/2017, 07/04/2018, 07/05/2020, 06/20/2021   Influenza-Unspecified 07/04/2018, 08/05/2019   PFIZER(Purple Top)SARS-COV-2 Vaccination 12/31/2019, 01/26/2020   PNEUMOCOCCAL CONJUGATE-20 05/30/2021   PPD Test 02/18/2018   Pneumococcal Polysaccharide-23 06/18/2016   Tdap 04/29/2013   Zoster Recombinant(Shingrix) 08/28/2018, 12/01/2018    Health Maintenance  Topic Date Due   DTaP/Tdap/Td (2 - Td or Tdap) 04/30/2023   COVID-19 Vaccine (3 - 2024-25 season) 06/15/2023   Medicare Annual Wellness (AWV)  10/22/2024   Colonoscopy  06/08/2025   Pneumonia Vaccine 75+ Years old  Completed   INFLUENZA VACCINE  Completed    Hepatitis C Screening  Completed   Zoster Vaccines- Shingrix  Completed   HPV VACCINES  Aged Out     Discussed health benefits of physical activity, and encouraged him to engage in regular exercise appropriate for his age and condition.    Problem List Items Addressed This Visit       Respiratory   COPD, group B, by GOLD 2017 classification (HCC)   Relevant Medications  albuterol  (VENTOLIN  HFA) 108 (90 Base) MCG/ACT inhaler   Other Visit Diagnoses       Medicare annual wellness visit, initial    -  Primary   Relevant Orders   EKG 12-Lead       He did mention that he has a pretty high deductible co-pay for his Anoro I encouraged him to reach out to his insurance company and find out if there is an alternative that is a lower tier and let us  know were happy to change it he does need a refill on his on his albuterol  so we did send that today.  No follow-ups on file.     Dorothyann Byars, MD

## 2023-10-23 NOTE — Patient Instructions (Addendum)
  [name] , Thank you for taking time to come for your Medicare Wellness Visit. I appreciate your ongoing commitment to your health goals. Please review the following plan we discussed and let me know if I can assist you in the future.   These are the goals we discussed:  Goals      Exercise 3x per week (30 min per time)     Continue to do your swimming and treadmill at the gym and try to add in maybe 2 days of resistance training.        This is a list of the screening recommended for you and due dates:  Health Maintenance  Topic Date Due   DTaP/Tdap/Td vaccine (2 - Td or Tdap) 04/30/2023   COVID-19 Vaccine (3 - 2024-25 season) 06/15/2023   Medicare Annual Wellness Visit  10/22/2024   Colon Cancer Screening  06/08/2025   Pneumonia Vaccine  Completed   Flu Shot  Completed   Hepatitis C Screening  Completed   Zoster (Shingles) Vaccine  Completed   HPV Vaccine  Aged Out

## 2023-12-14 ENCOUNTER — Other Ambulatory Visit: Payer: Self-pay | Admitting: Family Medicine

## 2023-12-14 DIAGNOSIS — I10 Essential (primary) hypertension: Secondary | ICD-10-CM

## 2023-12-15 ENCOUNTER — Encounter: Payer: Self-pay | Admitting: Family Medicine

## 2023-12-15 ENCOUNTER — Ambulatory Visit (INDEPENDENT_AMBULATORY_CARE_PROVIDER_SITE_OTHER): Payer: Medicare Other | Admitting: Family Medicine

## 2023-12-15 VITALS — BP 132/78 | HR 78 | Ht 66.0 in | Wt 251.0 lb

## 2023-12-15 DIAGNOSIS — R7301 Impaired fasting glucose: Secondary | ICD-10-CM | POA: Diagnosis not present

## 2023-12-15 DIAGNOSIS — I10 Essential (primary) hypertension: Secondary | ICD-10-CM

## 2023-12-15 DIAGNOSIS — Z711 Person with feared health complaint in whom no diagnosis is made: Secondary | ICD-10-CM | POA: Insufficient documentation

## 2023-12-15 DIAGNOSIS — J449 Chronic obstructive pulmonary disease, unspecified: Secondary | ICD-10-CM | POA: Diagnosis not present

## 2023-12-15 DIAGNOSIS — E785 Hyperlipidemia, unspecified: Secondary | ICD-10-CM

## 2023-12-15 DIAGNOSIS — K760 Fatty (change of) liver, not elsewhere classified: Secondary | ICD-10-CM | POA: Diagnosis not present

## 2023-12-15 LAB — POCT GLYCOSYLATED HEMOGLOBIN (HGB A1C): Hemoglobin A1C: 6.3 % — AB (ref 4.0–5.6)

## 2023-12-15 MED ORDER — RYBELSUS 3 MG PO TABS: 3.0000 mg | ORAL_TABLET | Freq: Every day | ORAL | 0 refills | Status: DC

## 2023-12-15 NOTE — Assessment & Plan Note (Signed)
 On current regimen but he does feel that his COPD does limit his exercise we discussed pretreating with 2 puffs of albuterol especially before doing cardio exercise to see if it helps with his exercise stamina.

## 2023-12-15 NOTE — Assessment & Plan Note (Addendum)
 A1C is 6.3 today.  Continue current regimen.  We did discuss a GLP-1 as a potential option.  Technically his A1c is under 6.5 currently but I think it would really help with his BMI which is 40 weight loss and his COPD.  He has a history of fatty liver.

## 2023-12-15 NOTE — Assessment & Plan Note (Signed)
 I do think he would benefit from a GLP-1.

## 2023-12-15 NOTE — Assessment & Plan Note (Signed)
 Gust returning to do a [facility] more formal evaluation for memory testing it may just be somewhat normal.  But I do want a make sure that it is not more significant and needs additional workup or treatment.  We can also get some additional labs.

## 2023-12-15 NOTE — Progress Notes (Signed)
 Established Patient Office Visit  Subjective  Patient ID: Lance Hernandez, male    DOB: September 27, 1956  Age: 68 y.o. MRN: [id number]  Chief Complaint  Patient presents with   COPD   Diabetes    HPI  He also has a history of elevated liver enzymes and we actually did do an ultrasound in November which did confirm fatty liver it also showed a simple cyst that was approximately 5 cm on the left kidney.  Mostly exercising 2 days/week but sometimes gets in 1/3-day mostly doing treadmill and occasionally swimming.  Also reports that he feels like he is just being a little bit more forgetful around the house nothing major but just enough that it seems noticeable he is not sure if it is just aging or not.    ROS    Objective:     BP 132/78   Pulse 78   Ht 5\' 6"  (1.676 m)   Wt 251 lb (113.9 kg)   SpO2 95%   BMI 40.51 kg/m    Physical Exam Vitals and nursing note reviewed.  Constitutional:      Appearance: Normal appearance.  HENT:     Head: Normocephalic and atraumatic.  Eyes:     Conjunctiva/sclera: Conjunctivae normal.  Cardiovascular:     Rate and Rhythm: Normal rate and regular rhythm.  Pulmonary:     Effort: Pulmonary effort is normal.     Breath sounds: Normal breath sounds.  Skin:    General: Skin is warm and dry.  Neurological:     Mental Status: He is alert.  Psychiatric:        Mood and Affect: Mood normal.      Results for orders placed or performed in visit on 12/15/23  POCT HgB A1C  Result Value Ref Range   Hemoglobin A1C 6.3 (A) 4.0 - 5.6 %   HbA1c POC (<> result, manual entry)     HbA1c, POC (prediabetic range)     HbA1c, POC (controlled diabetic range)        The 10-year ASCVD risk score (Arnett DK, et al., 2019) is: 14.6%    Assessment & Plan:   Problem List Items Addressed This Visit       Cardiovascular and Mediastinum   Essential hypertension, benign   Blood pressure looks good today better than in January.  He has been trying to  exercise consistently and is trying to work on his diet he just really struggles with wanting to eat the foods that he grew up with and has a really hard time making changes he really does not eat much vegetables.  Mostly meat and carbs.      Relevant Medications   Semaglutide (RYBELSUS) 3 MG TABS     Respiratory   COPD, group B, by GOLD 2017 classification (HCC)   On current regimen but he does feel that his COPD does limit his exercise we discussed pretreating with 2 puffs of albuterol especially before doing cardio exercise to see if it helps with his exercise stamina.      Relevant Medications   Semaglutide (RYBELSUS) 3 MG TABS     Digestive   Fatty liver   I do think he would benefit from a GLP-1.      Relevant Medications   Semaglutide (RYBELSUS) 3 MG TABS   Other Relevant Orders   Lipid panel   CMP14+EGFR     Endocrine   IFG (impaired fasting glucose) - Primary   A1C is 6.3 today.  Continue current regimen.  We did discuss a GLP-1 as a potential option.  Technically his A1c is under 6.5 currently but I think it would really help with his BMI which is 40 weight loss and his COPD.  He has a history of fatty liver.      Relevant Medications   Semaglutide (RYBELSUS) 3 MG TABS   Other Relevant Orders   POCT HgB A1C (Completed)   Lipid panel   CMP14+EGFR     Other   Hyperlipidemia   Relevant Medications   Semaglutide (RYBELSUS) 3 MG TABS   Other Relevant Orders   Lipid panel   Concern about memory   Gust returning to do a [facility] more formal evaluation for memory testing it may just be somewhat normal.  But I do want a make sure that it is not more significant and needs additional workup or treatment.  We can also get some additional labs.       Return in about 6 months (around 06/16/2024) for Pre-diabetes and memory testing. needs 40 min appt.    Nani Gasser, MD

## 2023-12-15 NOTE — Assessment & Plan Note (Signed)
 Blood pressure looks good today better than in January.  He has been trying to exercise consistently and is trying to work on his diet he just really struggles with wanting to eat the foods that he grew up with and has a really hard time making changes he really does not eat much vegetables.  Mostly meat and carbs.

## 2023-12-15 NOTE — Patient Instructions (Signed)
 Will need bloodwork in May

## 2023-12-19 ENCOUNTER — Telehealth: Payer: Self-pay

## 2023-12-19 MED ORDER — HYDROCORTISONE ACETATE 25 MG RE SUPP: 25.0000 mg | Freq: Two times a day (BID) | RECTAL | 11 refills | Status: DC | PRN

## 2023-12-19 NOTE — Telephone Encounter (Signed)
 Copied from CRM [id number]. Topic: Clinical - Medication Question >> Dec 19, 2023 10:42 AM Nila Nephew wrote: Reason for CRM: patient states PCP agreed to write a script for a strong generic hemorrhoid cream at last visit but that it did not happen. Patient would like this sent in to [facility] in [place].

## 2023-12-19 NOTE — Telephone Encounter (Signed)
 Forwarding message to Dr. Benjamin Stain covering Dr. Linford Arnold.

## 2023-12-19 NOTE — Telephone Encounter (Signed)
 Not sure what to do, I will send in [facility] for now.

## 2023-12-22 MED ORDER — HYDROCORTISONE 1 % EX CREA: 1.0000 | TOPICAL_CREAM | Freq: Two times a day (BID) | CUTANEOUS | 0 refills | Status: AC

## 2023-12-22 NOTE — Telephone Encounter (Signed)
 Spoke with patient states medication was not Anusol HC - he could not remember the name of the cream but states his pharmacy knew.  [facility] at [facility] [place] States patient is requesting hydrocortisone rectal cream 1% ( not suppositories) [facility] to [facility] [place].

## 2023-12-22 NOTE — Telephone Encounter (Signed)
 Sent!

## 2023-12-23 NOTE — Telephone Encounter (Signed)
 Patient informed.

## 2024-01-13 ENCOUNTER — Ambulatory Visit: Payer: Self-pay

## 2024-01-13 NOTE — Telephone Encounter (Signed)
  Chief Complaint: wide spread itching Symptoms: itching Frequency: ongoing/chronic, every year at this time Pertinent Negatives: Patient denies fever, bleeding, rash, difficulty breathing/SOB, scratchy throat Disposition: [] ED /[] Urgent Care (no appt availability in office) / [] Appointment(In office/virtual)/ []  [facility]/ [] Home Care/ [] Refused Recommended Disposition /[] [facility]/ [x]  Follow-up with PCP Additional Notes: Pt states that he gets this widespread itching every year during the spring time. Pt states that he talked to his pharmacist and they recommend hydroxyzine PO, in generic d/t his medication. Pt requesting  Copied from CRM [id number]. Topic: Clinical - Medical Advice >> Jan 13, 2024  1:40 PM Marland Kitchen D wrote: Patient needs a prescription for body itching he says the pharmacy recommended something. Please contact patient. >> Jan 13, 2024  1:42 PM Thliyah D wrote: Patient states it's due to pollen and it happens every year and that his pcp knows about it Reason for Disposition  Itching is a chronic symptom (recurrent or ongoing AND present > 4 weeks)  Answer Assessment - Initial Assessment Questions 1. DESCRIPTION: "Describe the itching you are having."     Pin pricks with itching 2. SEVERITY: "How bad is it?"    - MILD: Doesn't interfere with normal activities.   - MODERATE-SEVERE: Interferes with work, school, sleep, or other activities.      Mild-moderate 3. SCRATCHING: "Are there any scratch marks? Bleeding?"     Denies bleeding 4. ONSET: "When did this begin?"      Every year this happens during the pollen time 5. CAUSE: "What do you think is causing the itching?" (ask about swimming pools, pollen, animals, soaps, etc.)     Tree pollen, only in the spring 6. OTHER SYMPTOMS: "Do you have any other symptoms?"      denies  Protocols used: Itching - [facility]

## 2024-01-14 MED ORDER — HYDROXYZINE PAMOATE 25 MG PO CAPS: 25.0000 mg | ORAL_CAPSULE | Freq: Two times a day (BID) | ORAL | 0 refills | Status: DC | PRN

## 2024-01-14 NOTE — Telephone Encounter (Signed)
 Make sure takihg xyzal daily too.   Meds ordered this encounter  Medications   hydrOXYzine (VISTARIL) 25 MG capsule    Sig: Take 1 capsule (25 mg total) by mouth 2 (two) times daily as needed.    Dispense:  60 capsule    Refill:  0

## 2024-01-15 NOTE — Telephone Encounter (Signed)
Left message advising of medications.  

## 2024-01-16 ENCOUNTER — Other Ambulatory Visit: Payer: Self-pay

## 2024-01-16 MED ORDER — HYDROXYZINE PAMOATE 25 MG PO CAPS: 25.0000 mg | ORAL_CAPSULE | Freq: Two times a day (BID) | ORAL | 0 refills | Status: DC | PRN

## 2024-01-16 NOTE — Telephone Encounter (Signed)
 Prescription resent per [facility] [place]

## 2024-01-16 NOTE — Telephone Encounter (Signed)
 Copied from CRM [id number]. Topic: Clinical - Prescription Issue >> Jan 16, 2024 11:28 AM Adrianna P wrote: Reason for CRM: [facility] [address] [facility] on there [facility]  for hydrOXYzine (VISTARIL) 25 MG capsule

## 2024-01-19 ENCOUNTER — Other Ambulatory Visit: Payer: Self-pay | Admitting: Family Medicine

## 2024-01-19 NOTE — Telephone Encounter (Signed)
 Copied from CRM [id number]. Topic: Clinical - Medication Refill >> Jan 19, 2024 12:21 PM Hamdi H wrote: Most Recent Primary Care Visit:  Provider: Nani Gasser D  Department: [facility]  Visit Type: OFFICE VISIT  Date: 12/15/2023  Medication: hydrOXYzine (VISTARIL) 25 MG capsule  Has the patient contacted their pharmacy? Yes (Agent: If no, request that the patient contact the pharmacy for the refill. If patient does not wish to contact the pharmacy document the reason why and proceed with request.) (Agent: If yes, when and what did the pharmacy advise?) Pharmacy is calling  Is this the correct pharmacy for this prescription? Yes If no, delete pharmacy and type the correct one.  This is the patient's preferred pharmacy:  [id number] - [place], [place] - [address] [place] [address] Phone: [phone] Fax: [phone]   Has the prescription been filled recently? No  Is the patient out of the medication? Yes  Has the patient been seen for an appointment in the last year OR does the patient have an upcoming appointment? Yes  Can we respond through MyChart? Yes  Agent: Please be advised that Rx refills may take up to 3 business days. We ask that you follow-up with your pharmacy.

## 2024-01-28 ENCOUNTER — Other Ambulatory Visit: Payer: Self-pay | Admitting: *Deleted

## 2024-01-28 MED ORDER — HYDROXYZINE PAMOATE 25 MG PO CAPS: 25.0000 mg | ORAL_CAPSULE | Freq: Two times a day (BID) | ORAL | 1 refills | Status: DC | PRN

## 2024-02-19 ENCOUNTER — Other Ambulatory Visit: Payer: Self-pay | Admitting: Family Medicine

## 2024-02-19 NOTE — Telephone Encounter (Signed)
 Call patient and let him know that the hydroxyzine  is really as needed for anxiety so typically 60 capsules should really last him several months so we do not normally do a 90-day supply on this medication.  If he has had increase in his anxiety then I would recommend that we he make an appointment so that we can talk about what is going on and talk about some alternative options that would be safer to take on a daily basis

## 2024-02-20 MED ORDER — HYDROXYZINE PAMOATE 25 MG PO CAPS: 25.0000 mg | ORAL_CAPSULE | Freq: Every evening | ORAL | 1 refills | Status: DC | PRN

## 2024-02-20 NOTE — Telephone Encounter (Signed)
 So is he taking one a night? Then I need to update his script

## 2024-02-20 NOTE — Telephone Encounter (Signed)
 Meds ordered this encounter  Medications   hydrOXYzine  (VISTARIL ) 25 MG capsule    Sig: Take 1 capsule (25 mg total) by mouth at bedtime as needed.    Dispense:  90 capsule    Refill:  1

## 2024-02-20 NOTE — Telephone Encounter (Signed)
 Per patient, he takes the medication at night to help him sleep. He is not interested in discussing alternative meds at this time. Patient mentioned he has been on this medication for many years for anxiety and sleep. He tried to take melatonin to aid in his sleep but stated it did not work well for him. Patient is requesting for provider to authorize #60. Please advise. Thanks in advance.

## 2024-02-20 NOTE — Telephone Encounter (Signed)
 The patient is taking one tab at bedtime.

## 2024-02-23 NOTE — Telephone Encounter (Signed)
 LVM for the patient regarding his med refill sent to the pharmacy.

## 2024-03-02 DIAGNOSIS — K760 Fatty (change of) liver, not elsewhere classified: Secondary | ICD-10-CM | POA: Diagnosis not present

## 2024-03-02 DIAGNOSIS — R7301 Impaired fasting glucose: Secondary | ICD-10-CM | POA: Diagnosis not present

## 2024-03-02 DIAGNOSIS — E785 Hyperlipidemia, unspecified: Secondary | ICD-10-CM | POA: Diagnosis not present

## 2024-03-03 ENCOUNTER — Ambulatory Visit: Payer: Self-pay | Admitting: Family Medicine

## 2024-03-03 ENCOUNTER — Encounter: Payer: Self-pay | Admitting: Family Medicine

## 2024-03-03 LAB — CMP14+EGFR
ALT: 55 IU/L — ABNORMAL HIGH (ref 0–44)
AST: 34 IU/L (ref 0–40)
Albumin: 4.5 g/dL (ref 3.9–4.9)
Alkaline Phosphatase: 71 IU/L (ref 44–121)
BUN/Creatinine Ratio: 17 (ref 10–24)
BUN: 20 mg/dL (ref 8–27)
Bilirubin Total: 1 mg/dL (ref 0.0–1.2)
CO2: 23 mmol/L (ref 20–29)
Calcium: 9.6 mg/dL (ref 8.6–10.2)
Chloride: 102 mmol/L (ref 96–106)
Creatinine, Ser: 1.17 mg/dL (ref 0.76–1.27)
Globulin, Total: 2.8 g/dL (ref 1.5–4.5)
Glucose: 134 mg/dL — ABNORMAL HIGH (ref 70–99)
Potassium: 3.6 mmol/L (ref 3.5–5.2)
Sodium: 141 mmol/L (ref 134–144)
Total Protein: 7.3 g/dL (ref 6.0–8.5)
eGFR: 69 mL/min/{1.73_m2} (ref >59)

## 2024-03-03 LAB — LIPID PANEL
Chol/HDL Ratio: 3.3 ratio (ref 0.0–5.0)
Cholesterol, Total: 183 mg/dL (ref 100–199)
HDL: 55 mg/dL (ref >39)
LDL Chol Calc (NIH): 103 mg/dL — ABNORMAL HIGH (ref 0–99)
Triglycerides: 140 mg/dL (ref 0–149)
VLDL Cholesterol Cal: 25 mg/dL (ref 5–40)

## 2024-03-03 NOTE — Progress Notes (Signed)
 Lance Hernandez, LDL cholesterol is up just a little bit.  Ideal is under 100 continue to work on healthy diet and regular exercise.  The AST liver enzyme is back into the normal range which is absolutely fantastic and the ALT is trending down which is great so just continue to work on those positive lifestyle changes to reduce inflammation in the liver.

## 2024-04-08 ENCOUNTER — Other Ambulatory Visit: Payer: Self-pay | Admitting: Family Medicine

## 2024-04-08 DIAGNOSIS — J449 Chronic obstructive pulmonary disease, unspecified: Secondary | ICD-10-CM

## 2024-04-17 DIAGNOSIS — J208 Acute bronchitis due to other specified organisms: Secondary | ICD-10-CM | POA: Diagnosis not present

## 2024-04-17 DIAGNOSIS — J441 Chronic obstructive pulmonary disease with (acute) exacerbation: Secondary | ICD-10-CM | POA: Diagnosis not present

## 2024-04-17 DIAGNOSIS — Z20822 Contact with and (suspected) exposure to covid-19: Secondary | ICD-10-CM | POA: Diagnosis not present

## 2024-04-17 DIAGNOSIS — B9689 Other specified bacterial agents as the cause of diseases classified elsewhere: Secondary | ICD-10-CM | POA: Diagnosis not present

## 2024-04-21 ENCOUNTER — Other Ambulatory Visit: Payer: Self-pay | Admitting: Family Medicine

## 2024-06-05 ENCOUNTER — Other Ambulatory Visit: Payer: Self-pay | Admitting: Family Medicine

## 2024-06-05 DIAGNOSIS — K219 Gastro-esophageal reflux disease without esophagitis: Secondary | ICD-10-CM

## 2024-06-05 DIAGNOSIS — I10 Essential (primary) hypertension: Secondary | ICD-10-CM

## 2024-06-05 DIAGNOSIS — E785 Hyperlipidemia, unspecified: Secondary | ICD-10-CM

## 2024-06-05 DIAGNOSIS — F419 Anxiety disorder, unspecified: Secondary | ICD-10-CM

## 2024-06-05 DIAGNOSIS — N401 Enlarged prostate with lower urinary tract symptoms: Secondary | ICD-10-CM

## 2024-06-06 ENCOUNTER — Other Ambulatory Visit: Payer: Self-pay | Admitting: Family Medicine

## 2024-06-06 DIAGNOSIS — J449 Chronic obstructive pulmonary disease, unspecified: Secondary | ICD-10-CM

## 2024-06-09 ENCOUNTER — Other Ambulatory Visit: Payer: Self-pay | Admitting: Family Medicine

## 2024-06-09 DIAGNOSIS — E785 Hyperlipidemia, unspecified: Secondary | ICD-10-CM

## 2024-06-09 DIAGNOSIS — N401 Enlarged prostate with lower urinary tract symptoms: Secondary | ICD-10-CM

## 2024-06-09 DIAGNOSIS — K219 Gastro-esophageal reflux disease without esophagitis: Secondary | ICD-10-CM

## 2024-06-09 DIAGNOSIS — I10 Essential (primary) hypertension: Secondary | ICD-10-CM

## 2024-06-09 MED ORDER — HYDROXYZINE PAMOATE 25 MG PO CAPS: 25.0000 mg | ORAL_CAPSULE | Freq: Two times a day (BID) | ORAL | 1 refills | Status: DC | PRN

## 2024-06-09 NOTE — Telephone Encounter (Signed)
 Copied from CRM #[id number]. Topic: Clinical - Medication Refill >> Jun 09, 2024 11:17 AM Kevelyn M wrote: Medication: valsartan  (DIOVAN ) 320 MG tablet, hydrochlorothiazide  (MICROZIDE ) 12.5 MG capsule ,amLODipine  (NORVASC ) 5 MG tablet, atorvastatin  (LIPITOR) 40 MG tablet, finasteride  (PROSCAR ) 5 MG tablet,omeprazole  (PRILOSEC) 20 MG capsule, hydrOXYzine  (VISTARIL ) 25 MG capsule  90 days supply on all of them   Has the patient contacted their pharmacy? Yes (Agent: If no, request that the patient contact the pharmacy for the refill. If patient does not wish to contact the pharmacy document the reason why and proceed with request.) (Agent: If yes, when and what did the pharmacy advise?)  This is the patient's preferred pharmacy:  [id number] - [place], [place] - [address] [place] [address] Phone: [phone] Fax: [phone]  Is this the correct pharmacy for this prescription? Yes If no, delete pharmacy and type the correct one.   Has the prescription been filled recently? No  Is the patient out of the medication? No, one week to go on meds  Has the patient been seen for an appointment in the last year OR does the patient have an upcoming appointment? Yes  Can we respond through MyChart? Yes  Agent: Please be advised that Rx refills may take up to 3 business days. We ask that you follow-up with your pharmacy.

## 2024-06-15 ENCOUNTER — Encounter: Payer: Self-pay | Admitting: Sports Medicine

## 2024-06-16 ENCOUNTER — Ambulatory Visit: Attending: Family Medicine

## 2024-06-16 ENCOUNTER — Ambulatory Visit: Admitting: Family Medicine

## 2024-06-16 ENCOUNTER — Encounter: Payer: Self-pay | Admitting: Family Medicine

## 2024-06-16 VITALS — BP 120/85 | HR 81 | Ht 66.0 in | Wt 247.0 lb

## 2024-06-16 DIAGNOSIS — E785 Hyperlipidemia, unspecified: Secondary | ICD-10-CM | POA: Diagnosis not present

## 2024-06-16 DIAGNOSIS — R42 Dizziness and giddiness: Secondary | ICD-10-CM

## 2024-06-16 DIAGNOSIS — R0602 Shortness of breath: Secondary | ICD-10-CM

## 2024-06-16 DIAGNOSIS — I10 Essential (primary) hypertension: Secondary | ICD-10-CM

## 2024-06-16 DIAGNOSIS — R0789 Other chest pain: Secondary | ICD-10-CM

## 2024-06-16 DIAGNOSIS — K760 Fatty (change of) liver, not elsewhere classified: Secondary | ICD-10-CM | POA: Diagnosis not present

## 2024-06-16 DIAGNOSIS — R0609 Other forms of dyspnea: Secondary | ICD-10-CM

## 2024-06-16 DIAGNOSIS — R7301 Impaired fasting glucose: Secondary | ICD-10-CM

## 2024-06-16 DIAGNOSIS — Z6841 Body Mass Index (BMI) 40.0 and over, adult: Secondary | ICD-10-CM

## 2024-06-16 DIAGNOSIS — E118 Type 2 diabetes mellitus with unspecified complications: Secondary | ICD-10-CM | POA: Diagnosis not present

## 2024-06-16 DIAGNOSIS — J449 Chronic obstructive pulmonary disease, unspecified: Secondary | ICD-10-CM | POA: Diagnosis not present

## 2024-06-16 LAB — POCT GLYCOSYLATED HEMOGLOBIN (HGB A1C): HbA1c, POC (controlled diabetic range): 6.8 % (ref 0.0–7.0)

## 2024-06-16 MED ORDER — RYBELSUS 3 MG PO TABS
3.0000 mg | ORAL_TABLET | Freq: Every day | ORAL | 0 refills | Status: AC
Start: 2024-06-16 — End: ?

## 2024-06-16 NOTE — Assessment & Plan Note (Signed)
 BP at goal today.

## 2024-06-16 NOTE — Assessment & Plan Note (Signed)
 On ARB, statin.  Rybelsus  on list but not sure if taking or covered by insurance.

## 2024-06-16 NOTE — Progress Notes (Signed)
 Established Patient Office Visit  Subjective  Patient ID: Lance Hernandez, male    DOB: December 11, 1955  Age: 68 y.o. MRN: [id number]  Chief Complaint  Patient presents with   Dizziness    HPI  Discussed the use of AI scribe software for clinical note transcription with the patient, who gave verbal consent to proceed.  History of Present Illness Lance Hernandez is a 68 year old male with COPD who presents with lightheadedness and breathing issues.  Lightheadedness - Lightheadedness present for almost two months, described as a foggy sensation rather than true vertigo or presyncope - Occurs almost daily and persists throughout the day - Occasionally accompanied by sweating - No room spinning or severe dizziness - Stopped his hydroxyzine , which he used for sleep., thinking it could be the cause but didn't notice a differenc.  - Pulse rate increased to 114 bpm after stopping hydroxyzine ; normalized within a couple of days after resuming hydroxyzine   Dyspnea and exercise intolerance - Chronic shortness of breath, especially pronounced in the mornings - Difficulty exercising despite use of albuterol  - Increased fatigue and breathlessness with activity, including treadmill use and swimming  Cough and sputum production - Chronic phlegm and mucus production - went to urgent Care on 7/5 at [facility].   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  Constitutional:      Appearance: Normal appearance.  HENT:     Head: Normocephalic and atraumatic.  Eyes:     Conjunctiva/sclera: Conjunctivae normal.  Cardiovascular:     Rate and Rhythm: Normal rate and regular rhythm.  Pulmonary:     Effort: Pulmonary effort is normal.     Breath sounds: Normal breath sounds.  Skin:    General: Skin is warm and dry.  Neurological:     Mental Status: He is alert.  Psychiatric:        Mood and Affect: Mood normal.      Results for orders placed or performed in visit on 06/16/24  POCT HgB A1C  Result Value Ref Range   Hemoglobin A1C     HbA1c POC (<> result, manual entry)     HbA1c, POC (prediabetic range)     HbA1c, POC (controlled diabetic range) 6.8 0.0 - 7.0 %      The 10-year ASCVD risk score (Arnett DK, et al., 2019) is: 23.2%    Assessment & Plan:   Problem List Items Addressed This Visit       Cardiovascular and Mediastinum   Essential hypertension, benign - Primary   BP at goal today.       Relevant Medications   Semaglutide  (RYBELSUS ) 3 MG TABS     Respiratory   COPD,  group B, by GOLD 2017 classification (HCC)   Relevant Medications   Semaglutide  (RYBELSUS ) 3 MG TABS     Digestive   Fatty liver   Relevant Medications   Semaglutide  (RYBELSUS ) 3 MG TABS     Endocrine   Controlled diabetes mellitus type 2 with complications (HCC)   On ARB, statin.  Rybelsus  on list but not sure if taking or covered by insurance.       Relevant Medications   Semaglutide  (RYBELSUS ) 3 MG TABS   Other Relevant Orders   POCT HgB A1C (Completed)     Other   Hyperlipidemia   Relevant Medications   Semaglutide  (RYBELSUS ) 3 MG TABS   BMI 40.0-44.9, adult (HCC)   Continue to work on weight loss to  inmprove insulin resistance.       Relevant Medications   Semaglutide  (RYBELSUS ) 3 MG TABS   Other Visit Diagnoses       Atypical chest pain       Relevant Orders   LONG TERM MONITOR XT (3-14 DAYS)   ECHOCARDIOGRAM COMPLETE   TSH   CBC with Differential/Platelet   CMP14+EGFR   EKG 12-Lead     Shortness of breath       Relevant Orders   LONG TERM MONITOR XT (3-14 DAYS)   ECHOCARDIOGRAM COMPLETE   TSH   CBC with Differential/Platelet   CMP14+EGFR     Light headed       Relevant Orders   LONG TERM MONITOR XT (3-14 DAYS)   ECHOCARDIOGRAM COMPLETE   TSH   CBC with Differential/Platelet   CMP14+EGFR     Dyspnea on exertion           Assessment and Plan Assessment & Plan Chronic obstructive pulmonary disease (COPD) COPD with chronic bronchial inflammation and increased mucus production. Current treatment with Anoro Ellipta  ineffective. Pharmacist suggested switch due to lack of efficacy.  - Initiate budesonide  inhalation suspension twice daily for two weeks. - home monitor is defective, encouraged to get a new pulse oximeter - Mix albuterol  with budesonide  in nebulizer cup for administration. - Evaluate budesonide  effectiveness after two weeks and consider alternatives if necessary. - Discuss switching to a different inhaler if budesonide  is ineffective. We could try Stiolto  Lightheadedness Lightheadedness persisting for almost two months, not associated with vertigo or syncope. Normal oxygen levels. Differential includes orthostatic hypotension, cardiac issues, or anemia. Heart rate increased after stopping hydroxyzine . - Perform orthostatic blood pressure measurements. Normal today.  - Order echocardiogram to evaluate cardiac function. - Schedule heart monitor placement for two weeks to assess for arrhythmias. - Order blood tests to check hemoglobin levels and electrolytes.  EKG today snows first degree AV block. Present since 2017. Rate of 75 bpm.      Return in  about 6 months (around 12/14/2024), or if symptoms worsen or fail to improve.    Dorothyann Byars, MD

## 2024-06-16 NOTE — Progress Notes (Unsigned)
 EP to read.

## 2024-06-16 NOTE — Assessment & Plan Note (Signed)
 Continue to work on weight loss to inmprove insulin resistance.

## 2024-06-17 ENCOUNTER — Ambulatory Visit: Payer: Self-pay | Admitting: Family Medicine

## 2024-06-17 LAB — CMP14+EGFR
ALT: 48 IU/L — ABNORMAL HIGH (ref 0–44)
AST: 38 IU/L (ref 0–40)
Albumin: 4.6 g/dL (ref 3.9–4.9)
Alkaline Phosphatase: 68 IU/L (ref 44–121)
BUN/Creatinine Ratio: 17 (ref 10–24)
BUN: 20 mg/dL (ref 8–27)
Bilirubin Total: 1.1 mg/dL (ref 0.0–1.2)
CO2: 22 mmol/L (ref 20–29)
Calcium: 10.3 mg/dL — ABNORMAL HIGH (ref 8.6–10.2)
Chloride: 100 mmol/L (ref 96–106)
Creatinine, Ser: 1.18 mg/dL (ref 0.76–1.27)
Globulin, Total: 2.9 g/dL (ref 1.5–4.5)
Glucose: 138 mg/dL — ABNORMAL HIGH (ref 70–99)
Potassium: 3.7 mmol/L (ref 3.5–5.2)
Sodium: 141 mmol/L (ref 134–144)
Total Protein: 7.5 g/dL (ref 6.0–8.5)
eGFR: 68 mL/min/1.73 (ref >59)

## 2024-06-17 LAB — TSH: TSH: 1.9 u[IU]/mL (ref 0.450–4.500)

## 2024-06-17 LAB — CBC WITH DIFFERENTIAL/PLATELET
Basophils Absolute: 0 x10E3/uL (ref 0.0–0.2)
Basos: 1 %
EOS (ABSOLUTE): 0.1 x10E3/uL (ref 0.0–0.4)
Eos: 3 %
Hematocrit: 44.5 % (ref 37.5–51.0)
Hemoglobin: 15.2 g/dL (ref 13.0–17.7)
Immature Grans (Abs): 0 x10E3/uL (ref 0.0–0.1)
Immature Granulocytes: 0 %
Lymphocytes Absolute: 1.2 x10E3/uL (ref 0.7–3.1)
Lymphs: 24 %
MCH: 31.8 pg (ref 26.6–33.0)
MCHC: 34.2 g/dL (ref 31.5–35.7)
MCV: 93 fL (ref 79–97)
Monocytes Absolute: 0.5 x10E3/uL (ref 0.1–0.9)
Monocytes: 9 %
Neutrophils Absolute: 3.2 x10E3/uL (ref 1.4–7.0)
Neutrophils: 63 %
Platelets: 224 x10E3/uL (ref 150–450)
RBC: 4.78 x10E6/uL (ref 4.14–5.80)
RDW: 12.4 % (ref 11.6–15.4)
WBC: 5 x10E3/uL (ref 3.4–10.8)

## 2024-06-17 NOTE — Progress Notes (Signed)
 Hi [place], metabolic panel is okay.  Calciums up just by 1/10 of a point but not in a concerning range.  The AST liver enzyme is normal but the ALT is still elevated similar to in the past but it does seem to be getting gradually better which is great so just continue to work on eating a healthy diet and weight loss.  Thyroid  looks great.  Blood count is normal no sign of anemia or infection.

## 2024-07-06 ENCOUNTER — Telehealth: Payer: Self-pay

## 2024-07-06 ENCOUNTER — Other Ambulatory Visit (HOSPITAL_COMMUNITY): Payer: Self-pay

## 2024-07-06 NOTE — Telephone Encounter (Signed)
 Pharmacy Patient Advocate Encounter   Received notification from CoverMyMeds that prior authorization for Rybelsus  3MG  tablets is required/requested.   Insurance verification completed.   The patient is insured through [facility] .   Per test claim: PA required; PA submitted to above mentioned insurance via Latent Key/confirmation #/EOC [facility] Status is pending

## 2024-07-07 ENCOUNTER — Other Ambulatory Visit (HOSPITAL_COMMUNITY): Payer: Self-pay

## 2024-07-07 NOTE — Telephone Encounter (Signed)
 Pt notified through MyChart.

## 2024-07-07 NOTE — Telephone Encounter (Signed)
 Pharmacy Patient Advocate Encounter  Received notification from [employer] that Prior Authorization for Rybelsus  3MG  tablets has been APPROVED from 07/06/24 to 10/13/24. Ran test claim, Copay is $47. This test claim was processed through [facility]- copay amounts may vary at other pharmacies due to pharmacy/plan contracts, or as the patient moves through the different stages of their insurance plan.   PA #/Case ID/Reference #: [id number]

## 2024-07-08 DIAGNOSIS — R42 Dizziness and giddiness: Secondary | ICD-10-CM | POA: Diagnosis not present

## 2024-07-08 DIAGNOSIS — R0789 Other chest pain: Secondary | ICD-10-CM | POA: Diagnosis not present

## 2024-07-09 DIAGNOSIS — R0602 Shortness of breath: Secondary | ICD-10-CM

## 2024-07-09 DIAGNOSIS — R42 Dizziness and giddiness: Secondary | ICD-10-CM | POA: Diagnosis not present

## 2024-07-09 DIAGNOSIS — R0789 Other chest pain: Secondary | ICD-10-CM | POA: Diagnosis not present

## 2024-07-12 ENCOUNTER — Ambulatory Visit (HOSPITAL_BASED_OUTPATIENT_CLINIC_OR_DEPARTMENT_OTHER)
Admission: RE | Admit: 2024-07-12 | Discharge: 2024-07-12 | Disposition: A | Discharge status: Home / Self Care | Source: Ambulatory Visit | Attending: Family Medicine | Admitting: Family Medicine

## 2024-07-12 DIAGNOSIS — R42 Dizziness and giddiness: Secondary | ICD-10-CM | POA: Diagnosis not present

## 2024-07-12 DIAGNOSIS — R0789 Other chest pain: Secondary | ICD-10-CM | POA: Diagnosis not present

## 2024-07-12 DIAGNOSIS — R0602 Shortness of breath: Secondary | ICD-10-CM | POA: Diagnosis not present

## 2024-07-12 LAB — ECHOCARDIOGRAM COMPLETE
AR max vel: 1.75 cm2
AV Area VTI: 1.66 cm2
AV Area mean vel: 1.6 cm2
AV Mean grad: 5 mmHg
AV Peak grad: 8.8 mmHg
Ao pk vel: 1.48 m/s
Area-P 1/2: 2.33 cm2
Calc EF: 69.1 %
MV M vel: 2.03 m/s
MV Peak grad: 16.5 mmHg
S' Lateral: 2.4 cm
Single Plane A2C EF: 71.2 %
Single Plane A4C EF: 67.5 %

## 2024-07-12 NOTE — Progress Notes (Signed)
 HI Lance Hernandez,  Your echocardiogram shows good pumping function.  The walls of the left ventricle are moving well.  There is a little bit of backflow on the mitral valve but no narrowing which is great.  Aortic valve also looks good.  Overall echocardiogram is reassuring.

## 2024-07-13 NOTE — Progress Notes (Signed)
 Heart monitor results look very good and reassuring. Not sure why that didn't come into my basket but I was able ot view in your chart. Nothing to explain the dizzy feeling.  I don't think heart related. If still having sxs we can refer to Neurology. If he is feeling some better then we can monitor.

## 2024-07-14 ENCOUNTER — Telehealth: Payer: Self-pay | Admitting: Family Medicine

## 2024-07-14 DIAGNOSIS — J449 Chronic obstructive pulmonary disease, unspecified: Secondary | ICD-10-CM

## 2024-07-14 NOTE — Telephone Encounter (Signed)
 Prescription Request  07/14/2024  LOV: 06/16/2024  What is the name of the medication or equipment? budesonide -formoterol  (SYMBICORT ) 160-4.5 MCG/ACT inhaler and  albuterol  (VENTOLIN  HFA) 108 (90 Base) MCG/ACT inhaler   Have you contacted your pharmacy to request a refill? Yes   Which pharmacy would you like this sent to?  [id number] - [place], [place] - [address] [place] [address] Phone: [phone] Fax: [phone]    Patient notified that their request is being sent to the clinical staff for review and that they should receive a response within 2 business days.   Please advise at [facility] [phone]

## 2024-07-14 NOTE — Progress Notes (Signed)
 Hi [place], the heart monitor results came back into my in basket.  Overall it was reassuring no sign of atrial fibrillation which is what we were trying to rule out there were few little extra beats but nothing worrisome nothing that was sustained.  And no trigger episodes which was good.  Please see the additional note under MyChart.

## 2024-07-15 NOTE — Telephone Encounter (Unsigned)
 Budesonide -formoterol  160-4.5 inhaler has not been filled since 2017 and this was given by another provider.   Dr. Alvan prescribed him Anoro Ellipta  625-25 inhaler .   Spoke with pt he states that the Anoro doesn't help and he stated that he had spoken to Dr. Alvan about this.   He asked that I send the nebulizer solutions.

## 2024-07-16 MED ORDER — BUDESONIDE 0.25 MG/2ML IN SUSP: 0.2500 mg | Freq: Every day | RESPIRATORY_TRACT | 1 refills | Status: AC

## 2024-07-16 MED ORDER — ALBUTEROL SULFATE (2.5 MG/3ML) 0.083% IN NEBU: 2.5000 mg | INHALATION_SOLUTION | Freq: Every day | RESPIRATORY_TRACT | 1 refills | Status: AC

## 2024-09-20 ENCOUNTER — Other Ambulatory Visit (HOSPITAL_COMMUNITY): Payer: Self-pay

## 2024-09-27 ENCOUNTER — Other Ambulatory Visit (HOSPITAL_COMMUNITY): Payer: Self-pay

## 2024-09-27 ENCOUNTER — Telehealth: Payer: Self-pay

## 2024-09-27 NOTE — Telephone Encounter (Signed)
 Pharmacy Patient Advocate Encounter   Received notification from Onbase that prior authorization for Rybelsus  3 is required/requested.   Insurance verification completed.   The patient is insured through [facility].   Per test claim: PA required; PA submitted to above mentioned insurance via Latent Key/confirmation #/EOC [id number] Status is pending

## 2024-09-28 ENCOUNTER — Other Ambulatory Visit (HOSPITAL_COMMUNITY): Payer: Self-pay

## 2024-09-28 NOTE — Telephone Encounter (Signed)
 Pharmacy Patient Advocate Encounter  Received notification from [employer] that Prior Authorization for Rybelsus  3 has been APPROVED from 09/27/24 to 10/13/25. Ran test claim, Copay is $17.00. This test claim was processed through [facility]- copay amounts may vary at other pharmacies due to pharmacy/plan contracts, or as the patient moves through the different stages of their insurance plan.   PA #/Case ID/Reference #: # [id number]

## 2024-10-09 ENCOUNTER — Other Ambulatory Visit: Payer: Self-pay | Admitting: Family Medicine

## 2024-11-04 ENCOUNTER — Telehealth: Payer: Self-pay

## 2024-11-04 NOTE — Telephone Encounter (Signed)
 Please review the previous CRM. No appointments available tomorrow at [facility]. Please advise, thanks.

## 2024-11-04 NOTE — Telephone Encounter (Signed)
 Copied from CRM #[id number]. Topic: Clinical - Medication Question >> Nov 04, 2024  2:35 PM Kevelyn M wrote: Reason for CRM: Patient calling because he has a big hemorriod that's bothering him. It shrunk alittle bit, but it's still bothering him and would like a stronger prescription than the 1% Hydrocortisone  usp. He would like it by tomorrow before storm.  [id number] - [place], [place] - [address], [place] [address] Phone: [phone]  Fax: [phone]   Call back # [phone]

## 2024-11-05 MED ORDER — HYDROCORTISONE (PERIANAL) 2.5 % EX CREA: 1.0000 | TOPICAL_CREAM | Freq: Two times a day (BID) | CUTANEOUS | 3 refills | Status: AC | PRN

## 2024-11-05 NOTE — Telephone Encounter (Signed)
 Meds ordered this encounter  Medications   hydrocortisone  (ANUSOL -HC) 2.5 % rectal cream    Sig: Place 1 Application rectally 2 (two) times daily as needed for hemorrhoids or anal itching.    Dispense:  30 g    Refill:  3

## 2024-11-05 NOTE — Telephone Encounter (Signed)
Called pt and advised of medication being sent.

## 2024-12-14 ENCOUNTER — Ambulatory Visit: Admitting: Family Medicine
